# Patient Record
Sex: Female | Born: 1935 | Race: Black or African American | Hispanic: No | Smoking: Never smoker
Health system: Southern US, Community
[De-identification: ages and names within clinical notes are randomized; demographics above are authoritative.]

## PROBLEM LIST (undated history)

## (undated) DIAGNOSIS — E119 Type 2 diabetes mellitus without complications: Secondary | ICD-10-CM

## (undated) DIAGNOSIS — I10 Essential (primary) hypertension: Secondary | ICD-10-CM

## (undated) DIAGNOSIS — N289 Disorder of kidney and ureter, unspecified: Secondary | ICD-10-CM

## (undated) DIAGNOSIS — I2510 Atherosclerotic heart disease of native coronary artery without angina pectoris: Secondary | ICD-10-CM

## (undated) DIAGNOSIS — C801 Malignant (primary) neoplasm, unspecified: Secondary | ICD-10-CM

## (undated) DIAGNOSIS — C9110 Chronic lymphocytic leukemia of B-cell type not having achieved remission: Secondary | ICD-10-CM

## (undated) DIAGNOSIS — H269 Unspecified cataract: Secondary | ICD-10-CM

## (undated) DIAGNOSIS — N390 Urinary tract infection, site not specified: Secondary | ICD-10-CM

## (undated) DIAGNOSIS — T148XXA Other injury of unspecified body region, initial encounter: Secondary | ICD-10-CM

## (undated) HISTORY — PX: TOTAL HIP ARTHROPLASTY: SHX124

## (undated) HISTORY — PX: CHOLECYSTECTOMY: SHX55

## (undated) HISTORY — PX: APPENDECTOMY: SHX54

---

## 2007-06-17 ENCOUNTER — Other Ambulatory Visit: Payer: Self-pay

## 2007-06-17 ENCOUNTER — Inpatient Hospital Stay: Payer: Self-pay | Admitting: Orthopaedic Surgery

## 2007-06-22 ENCOUNTER — Encounter: Payer: Self-pay | Admitting: Internal Medicine

## 2007-07-12 ENCOUNTER — Encounter: Payer: Self-pay | Admitting: Internal Medicine

## 2008-09-06 ENCOUNTER — Observation Stay: Payer: Self-pay | Admitting: Internal Medicine

## 2008-09-15 ENCOUNTER — Ambulatory Visit: Payer: Self-pay | Admitting: Unknown Physician Specialty

## 2010-06-19 ENCOUNTER — Observation Stay: Payer: Self-pay | Admitting: Internal Medicine

## 2011-12-30 ENCOUNTER — Emergency Department: Payer: Self-pay | Admitting: *Deleted

## 2011-12-30 LAB — CBC WITH DIFFERENTIAL/PLATELET
Basophil #: 0 10*3/uL (ref 0.0–0.1)
Basophil %: 0.5 %
Eosinophil %: 1 %
HCT: 41 % (ref 35.0–47.0)
HGB: 13.4 g/dL (ref 12.0–16.0)
Lymphocyte %: 39.6 %
MCHC: 32.7 g/dL (ref 32.0–36.0)
Monocyte %: 4.8 %
Neutrophil #: 4.5 10*3/uL (ref 1.4–6.5)
Platelet: 150 10*3/uL (ref 150–440)
RBC: 4.08 10*6/uL (ref 3.80–5.20)
WBC: 8.3 10*3/uL (ref 3.6–11.0)

## 2011-12-30 LAB — COMPREHENSIVE METABOLIC PANEL
Alkaline Phosphatase: 48 U/L — ABNORMAL LOW (ref 50–136)
BUN: 20 mg/dL — ABNORMAL HIGH (ref 7–18)
Bilirubin,Total: 0.5 mg/dL (ref 0.2–1.0)
Chloride: 104 mmol/L (ref 98–107)
Co2: 31 mmol/L (ref 21–32)
Creatinine: 1.21 mg/dL (ref 0.60–1.30)
EGFR (Non-African Amer.): 38 — ABNORMAL LOW
Osmolality: 289 (ref 275–301)
SGPT (ALT): 17 U/L
Total Protein: 6.9 g/dL (ref 6.4–8.2)

## 2011-12-30 LAB — TROPONIN I: Troponin-I: 0.03 ng/mL

## 2011-12-30 LAB — CK TOTAL AND CKMB (NOT AT ARMC)
CK, Total: 35 U/L (ref 21–215)
CK-MB: <0.5 ng/mL — ABNORMAL LOW (ref 0.5–3.6)

## 2013-08-13 ENCOUNTER — Ambulatory Visit: Payer: Self-pay | Admitting: Internal Medicine

## 2013-09-08 ENCOUNTER — Ambulatory Visit: Payer: Self-pay | Admitting: Internal Medicine

## 2016-04-16 ENCOUNTER — Emergency Department (HOSPITAL_COMMUNITY): Payer: Medicare (Managed Care)

## 2016-04-16 ENCOUNTER — Encounter (HOSPITAL_COMMUNITY): Payer: Self-pay | Admitting: *Deleted

## 2016-04-16 ENCOUNTER — Inpatient Hospital Stay (HOSPITAL_COMMUNITY)
Admission: EM | Admit: 2016-04-16 | Discharge: 2016-04-18 | DRG: 292 | Disposition: A | Discharge status: Home Health | Payer: Medicare (Managed Care) | Attending: Family Medicine | Admitting: Family Medicine

## 2016-04-16 DIAGNOSIS — I110 Hypertensive heart disease with heart failure: Secondary | ICD-10-CM | POA: Diagnosis present

## 2016-04-16 DIAGNOSIS — D72829 Elevated white blood cell count, unspecified: Secondary | ICD-10-CM | POA: Diagnosis not present

## 2016-04-16 DIAGNOSIS — E876 Hypokalemia: Secondary | ICD-10-CM | POA: Diagnosis not present

## 2016-04-16 DIAGNOSIS — W19XXXA Unspecified fall, initial encounter: Secondary | ICD-10-CM | POA: Diagnosis present

## 2016-04-16 DIAGNOSIS — E119 Type 2 diabetes mellitus without complications: Secondary | ICD-10-CM

## 2016-04-16 DIAGNOSIS — I2510 Atherosclerotic heart disease of native coronary artery without angina pectoris: Secondary | ICD-10-CM | POA: Diagnosis present

## 2016-04-16 DIAGNOSIS — Z7982 Long term (current) use of aspirin: Secondary | ICD-10-CM | POA: Diagnosis not present

## 2016-04-16 DIAGNOSIS — I509 Heart failure, unspecified: Secondary | ICD-10-CM

## 2016-04-16 DIAGNOSIS — W010XXA Fall on same level from slipping, tripping and stumbling without subsequent striking against object, initial encounter: Secondary | ICD-10-CM | POA: Diagnosis present

## 2016-04-16 DIAGNOSIS — Z66 Do not resuscitate: Secondary | ICD-10-CM | POA: Diagnosis present

## 2016-04-16 DIAGNOSIS — F0280 Dementia in other diseases classified elsewhere without behavioral disturbance: Secondary | ICD-10-CM | POA: Diagnosis present

## 2016-04-16 DIAGNOSIS — R0902 Hypoxemia: Secondary | ICD-10-CM | POA: Diagnosis present

## 2016-04-16 DIAGNOSIS — I5031 Acute diastolic (congestive) heart failure: Secondary | ICD-10-CM | POA: Diagnosis present

## 2016-04-16 DIAGNOSIS — Z96642 Presence of left artificial hip joint: Secondary | ICD-10-CM | POA: Diagnosis present

## 2016-04-16 DIAGNOSIS — M25552 Pain in left hip: Secondary | ICD-10-CM | POA: Diagnosis present

## 2016-04-16 DIAGNOSIS — R748 Abnormal levels of other serum enzymes: Secondary | ICD-10-CM

## 2016-04-16 DIAGNOSIS — C9110 Chronic lymphocytic leukemia of B-cell type not having achieved remission: Secondary | ICD-10-CM | POA: Diagnosis present

## 2016-04-16 DIAGNOSIS — G309 Alzheimer's disease, unspecified: Secondary | ICD-10-CM | POA: Diagnosis present

## 2016-04-16 DIAGNOSIS — I10 Essential (primary) hypertension: Secondary | ICD-10-CM | POA: Diagnosis present

## 2016-04-16 DIAGNOSIS — Z7984 Long term (current) use of oral hypoglycemic drugs: Secondary | ICD-10-CM | POA: Diagnosis not present

## 2016-04-16 DIAGNOSIS — D181 Lymphangioma, any site: Secondary | ICD-10-CM | POA: Diagnosis present

## 2016-04-16 HISTORY — DX: Disorder of kidney and ureter, unspecified: N28.9

## 2016-04-16 HISTORY — DX: Type 2 diabetes mellitus without complications: E11.9

## 2016-04-16 HISTORY — DX: Malignant (primary) neoplasm, unspecified: C80.1

## 2016-04-16 HISTORY — DX: Urinary tract infection, site not specified: N39.0

## 2016-04-16 HISTORY — DX: Unspecified cataract: H26.9

## 2016-04-16 HISTORY — DX: Other injury of unspecified body region, initial encounter: T14.8XXA

## 2016-04-16 HISTORY — DX: Chronic lymphocytic leukemia of B-cell type not having achieved remission: C91.10

## 2016-04-16 HISTORY — DX: Atherosclerotic heart disease of native coronary artery without angina pectoris: I25.10

## 2016-04-16 HISTORY — DX: Essential (primary) hypertension: I10

## 2016-04-16 LAB — CBC WITH DIFFERENTIAL/PLATELET
BASOS ABS: 0 10*3/uL (ref 0.0–0.1)
BASOS PCT: 0 %
EOS ABS: 0.2 10*3/uL (ref 0.0–0.7)
Eosinophils Relative: 1 %
HEMATOCRIT: 39.6 % (ref 36.0–46.0)
HEMOGLOBIN: 13.7 g/dL (ref 12.0–15.0)
LYMPHS PCT: 52 %
Lymphs Abs: 8 10*3/uL — ABNORMAL HIGH (ref 0.7–4.0)
MCH: 32.3 pg (ref 26.0–34.0)
MCHC: 34.6 g/dL (ref 30.0–36.0)
MCV: 93.4 fL (ref 78.0–100.0)
MONOS PCT: 5 %
Monocytes Absolute: 0.8 10*3/uL (ref 0.1–1.0)
NEUTROS ABS: 6.6 10*3/uL (ref 1.7–7.7)
NEUTROS PCT: 42 %
Platelets: 163 10*3/uL (ref 150–400)
RBC: 4.24 MIL/uL (ref 3.87–5.11)
RDW: 13.3 % (ref 11.5–15.5)
WBC: 15.6 10*3/uL — ABNORMAL HIGH (ref 4.0–10.5)

## 2016-04-16 LAB — URINALYSIS, ROUTINE W REFLEX MICROSCOPIC
Bilirubin Urine: NEGATIVE
Glucose, UA: NEGATIVE mg/dL
Hgb urine dipstick: NEGATIVE
Ketones, ur: 15 mg/dL — AB
NITRITE: NEGATIVE
PH: 7.5 (ref 5.0–8.0)
Protein, ur: NEGATIVE mg/dL
SPECIFIC GRAVITY, URINE: 1.013 (ref 1.005–1.030)

## 2016-04-16 LAB — BRAIN NATRIURETIC PEPTIDE: B Natriuretic Peptide: 179.2 pg/mL — ABNORMAL HIGH (ref 0.0–100.0)

## 2016-04-16 LAB — BASIC METABOLIC PANEL
Anion gap: 7 (ref 5–15)
BUN: 15 mg/dL (ref 6–20)
CHLORIDE: 104 mmol/L (ref 101–111)
CO2: 29 mmol/L (ref 22–32)
Calcium: 10.2 mg/dL (ref 8.9–10.3)
Creatinine, Ser: 0.9 mg/dL (ref 0.44–1.00)
GFR calc non Af Amer: 52 mL/min — ABNORMAL LOW (ref >60)
GFR, EST AFRICAN AMERICAN: 60 mL/min — AB (ref >60)
Glucose, Bld: 126 mg/dL — ABNORMAL HIGH (ref 65–99)
POTASSIUM: 3.6 mmol/L (ref 3.5–5.1)
SODIUM: 140 mmol/L (ref 135–145)

## 2016-04-16 LAB — HEPATIC FUNCTION PANEL
ALBUMIN: 3.9 g/dL (ref 3.5–5.0)
ALT: 11 U/L — AB (ref 14–54)
AST: 18 U/L (ref 15–41)
Alkaline Phosphatase: 44 U/L (ref 38–126)
Bilirubin, Direct: 0.1 mg/dL (ref 0.1–0.5)
Indirect Bilirubin: 0.7 mg/dL (ref 0.3–0.9)
TOTAL PROTEIN: 6 g/dL — AB (ref 6.5–8.1)
Total Bilirubin: 0.8 mg/dL (ref 0.3–1.2)

## 2016-04-16 LAB — URINE MICROSCOPIC-ADD ON: RBC / HPF: NONE SEEN RBC/hpf (ref 0–5)

## 2016-04-16 LAB — TROPONIN I: TROPONIN I: 0.1 ng/mL — AB (ref <0.03)

## 2016-04-16 LAB — GLUCOSE, CAPILLARY: Glucose-Capillary: 248 mg/dL — ABNORMAL HIGH (ref 65–99)

## 2016-04-16 LAB — PATHOLOGIST SMEAR REVIEW

## 2016-04-16 MED ORDER — SODIUM CHLORIDE 0.9% FLUSH: 3.0000 mL | INTRAVENOUS | Status: DC | PRN

## 2016-04-16 MED ORDER — FENTANYL CITRATE (PF) 100 MCG/2ML IJ SOLN
25.0000 ug | INTRAMUSCULAR | Status: DC | PRN
  Administered 2016-04-16: 25 ug via INTRAVENOUS
  Filled 2016-04-16: qty 2

## 2016-04-16 MED ORDER — POLYETHYLENE GLYCOL 3350 17 G PO PACK: 17.0000 g | PACK | Freq: Every day | ORAL | Status: DC | PRN

## 2016-04-16 MED ORDER — FUROSEMIDE 10 MG/ML IJ SOLN
20.0000 mg | Freq: Every day | INTRAMUSCULAR | Status: DC
  Administered 2016-04-17 – 2016-04-18 (×2): 20 mg via INTRAVENOUS
  Filled 2016-04-16 (×2): qty 2

## 2016-04-16 MED ORDER — SODIUM CHLORIDE 0.9 % IV SOLN: 250.0000 mL | INTRAVENOUS | Status: DC | PRN

## 2016-04-16 MED ORDER — ASPIRIN 81 MG PO CHEW
324.0000 mg | CHEWABLE_TABLET | Freq: Once | ORAL | Status: AC
  Administered 2016-04-16: 324 mg via ORAL
  Filled 2016-04-16: qty 4

## 2016-04-16 MED ORDER — ASPIRIN EC 81 MG PO TBEC
81.0000 mg | DELAYED_RELEASE_TABLET | Freq: Every day | ORAL | Status: DC
  Administered 2016-04-17 – 2016-04-18 (×2): 81 mg via ORAL
  Filled 2016-04-16 (×2): qty 1

## 2016-04-16 MED ORDER — METFORMIN HCL 500 MG PO TABS
1000.0000 mg | ORAL_TABLET | Freq: Every day | ORAL | Status: DC
  Administered 2016-04-17 – 2016-04-18 (×2): 1000 mg via ORAL
  Filled 2016-04-16 (×2): qty 2

## 2016-04-16 MED ORDER — FUROSEMIDE 10 MG/ML IJ SOLN
20.0000 mg | Freq: Once | INTRAMUSCULAR | Status: AC
  Administered 2016-04-16: 20 mg via INTRAVENOUS
  Filled 2016-04-16: qty 4

## 2016-04-16 MED ORDER — SENNOSIDES-DOCUSATE SODIUM 8.6-50 MG PO TABS
1.0000 | ORAL_TABLET | Freq: Two times a day (BID) | ORAL | Status: DC
  Administered 2016-04-16 – 2016-04-18 (×4): 1 via ORAL
  Filled 2016-04-16 (×4): qty 1

## 2016-04-16 MED ORDER — DONEPEZIL HCL 10 MG PO TABS
10.0000 mg | ORAL_TABLET | Freq: Every day | ORAL | Status: DC
  Administered 2016-04-16 – 2016-04-17 (×2): 10 mg via ORAL
  Filled 2016-04-16 (×2): qty 1

## 2016-04-16 MED ORDER — LEVOTHYROXINE SODIUM 50 MCG PO TABS
50.0000 ug | ORAL_TABLET | Freq: Every day | ORAL | Status: DC
  Administered 2016-04-17 – 2016-04-18 (×2): 50 ug via ORAL
  Filled 2016-04-16 (×2): qty 1

## 2016-04-16 MED ORDER — SODIUM CHLORIDE 0.9% FLUSH
3.0000 mL | Freq: Two times a day (BID) | INTRAVENOUS | Status: DC
  Administered 2016-04-16 – 2016-04-18 (×4): 3 mL via INTRAVENOUS

## 2016-04-16 MED ORDER — MAGNESIUM HYDROXIDE 400 MG/5ML PO SUSP: 30.0000 mL | Freq: Every day | ORAL | Status: DC | PRN

## 2016-04-16 MED ORDER — ONDANSETRON HCL 4 MG/2ML IJ SOLN
4.0000 mg | Freq: Four times a day (QID) | INTRAMUSCULAR | Status: DC | PRN
  Administered 2016-04-16: 4 mg via INTRAVENOUS
  Filled 2016-04-16: qty 2

## 2016-04-16 MED ORDER — ENOXAPARIN SODIUM 40 MG/0.4ML ~~LOC~~ SOLN
40.0000 mg | SUBCUTANEOUS | Status: DC
  Administered 2016-04-16 – 2016-04-17 (×2): 40 mg via SUBCUTANEOUS
  Filled 2016-04-16 (×2): qty 0.4

## 2016-04-16 MED ORDER — AMLODIPINE BESYLATE 5 MG PO TABS
5.0000 mg | ORAL_TABLET | Freq: Every day | ORAL | Status: DC
  Administered 2016-04-17 – 2016-04-18 (×2): 5 mg via ORAL
  Filled 2016-04-16 (×2): qty 1

## 2016-04-16 MED ORDER — BENAZEPRIL HCL 10 MG PO TABS
20.0000 mg | ORAL_TABLET | Freq: Every day | ORAL | Status: DC
  Administered 2016-04-17 – 2016-04-18 (×2): 20 mg via ORAL
  Filled 2016-04-16 (×3): qty 2

## 2016-04-16 MED ORDER — ACETAMINOPHEN 500 MG PO TABS: 500.0000 mg | ORAL_TABLET | Freq: Four times a day (QID) | ORAL | Status: DC | PRN

## 2016-04-16 NOTE — ED Notes (Signed)
Patient transported to X-ray 

## 2016-04-16 NOTE — ED Notes (Signed)
Applied 2L oxygen after administration of Fentanyl 25 mcg due to hypoxia. Pt mentation is at baseline.

## 2016-04-16 NOTE — Progress Notes (Addendum)
Pt's daughter and pt confirms pt has pcp at [place] pt with DME at home walker, cane and bedside commode

## 2016-04-16 NOTE — ED Triage Notes (Signed)
Pt BIB EMS from home after fall. She was found on her left side were she has PMH of  lf. Hip replacement. Per EMS, there was no tenderness assessed on palpation. Member in family states she has syncope episode when heart rate drops.  She had PAC's on the monitor according to EKG performed in route.

## 2016-04-16 NOTE — H&P (Signed)
History and Physical  Jessica Daniel [id number] DOB: 1915/12/09 DOA: 04/16/2016  Referring physician:  Duffy Bruce, ER physician PCP: Sherian Maroon, MD as part of pace program  Patient coming from: Home & is able to ambulate using a walker  Chief Complaint: Fall   HPI: Jessica Daniel is a 82 y.o. female with medical history significant of dementia, diabetes mellitus and hypertension who lives at home with her granddaughter and often times will spend a day or 2 in bed and then get up and ambulate. Today she lost her balance and fell landing on her left side. She had trouble getting up so paramedics were called. Patient was brought into the emergency room further evaluation  ED Course: In the emergency room, x-rays showed no evidence of fracture. Patient did look to have more labored breathing. A chest x-ray noted some mild bilateral effusions consistent with mild CHF. Patient with no previous diagnosis. Rest of lab work unremarkable other than white count of 15.6, troponin of 0.1 and a BNP of 180. Hospitalists call for further evaluation.  Review of Systems: Patient seen in the emergency room . With her chronic dementia, she is not a good historian. She denies any complaints. Says that she is okay. Denies any shortness of breath or chest pain. Denies any hip pain    Review of systems are otherwise negative  Past Medical History:  Diagnosis Date  . Cancer ([place])   . Cataract   . CLL (chronic lymphocytic leukemia) ([place])   . Coronary artery disease   . Diabetes mellitus without complication ([place])   . Fracture    lf. wrist, hip, ankle  . Hypertension   . Renal disorder   . UTI (urinary tract infection)    Past Surgical History:  Procedure Laterality Date  . APPENDECTOMY    . CHOLECYSTECTOMY    . TOTAL HIP ARTHROPLASTY Left     Social History:  reports that she has never smoked. She has never used smokeless tobacco. She reports that she does not drink alcohol or use  drugs. She lives at home with her granddaughter   No Known Allergies  Family history: High blood pressure  Prior to Admission medications   Medication Sig Start Date End Date Taking? Authorizing Provider  acetaminophen (TYLENOL) 500 MG tablet Take 500-1,000 mg by mouth every 6 (six) hours as needed for moderate pain.   Yes Historical Provider, MD  amLODipine (NORVASC) 5 MG tablet Take 5 mg by mouth daily.   Yes Historical Provider, MD  aspirin EC 81 MG tablet Take 81 mg by mouth daily.   Yes Historical Provider, MD  benazepril (LOTENSIN) 20 MG tablet Take 20 mg by mouth daily.   Yes Historical Provider, MD  donepezil (ARICEPT) 10 MG tablet Take 10 mg by mouth at bedtime.   Yes Historical Provider, MD  levothyroxine (SYNTHROID, LEVOTHROID) 50 MCG tablet Take 50 mcg by mouth daily before breakfast.   Yes Historical Provider, MD  magnesium hydroxide (MILK OF MAGNESIA) 400 MG/5ML suspension Take 30 mLs by mouth daily as needed for mild constipation or moderate constipation.   Yes Historical Provider, MD  metFORMIN (GLUCOPHAGE) 1000 MG tablet Take 1,000 mg by mouth daily with breakfast.   Yes Historical Provider, MD  Multiple Minerals-Vitamins (CALCIUM & VIT D3 BONE HEALTH PO) Take 1 tablet by mouth every evening.   Yes Historical Provider, MD  Multiple Vitamins-Minerals (MULTIVITAMIN WITH MINERALS) tablet Take 1 tablet by mouth daily.   Yes Historical Provider, MD  polyethylene glycol (MIRALAX / GLYCOLAX) packet Take 17 g by mouth daily as needed for moderate constipation.   Yes Historical Provider, MD  sennosides-docusate sodium (SENOKOT-S) 8.6-50 MG tablet Take 1 tablet by mouth 2 (two) times daily.   Yes Historical Provider, MD    Physical Exam: BP 145/74   Pulse 63   Temp 97.8 F (36.6 C) (Oral)   Resp 19   Ht 5\' 7"  (1.702 m)   Wt 74.4 kg (164 lb 0.4 oz)   SpO2 100%   BMI 25.69 kg/m   General:  Alert and oriented 2, no acute distress  Eyes: Sclera nonicteric, extraocular  movements are intact  ENT: Normocephalic major rheumatic, mucous membranes are slightly dry  Neck: Supple, no JVD  Cardiovascular: Soft, regular rate and rhythm, [id number], 2/6 systolic ejection murmur  Respiratory: Clear auscultation bilaterally, decreased breath sounds bibasilar Abdomen: Soft, obese, nontender, positive bowel sounds  Skin: No skin breaks, tears or lesions  Musculoskeletal: No clubbing or cyanosis, trace pitting edema from the knees down bilaterally  Psychiatric: Patient is appropriate, no evidence of psychoses, chronic dementia  Neurologic: No focal deficits           Labs on Admission:  Basic Metabolic Panel:  Recent Labs Lab 04/16/16 1004  NA 140  K 3.6  CL 104  CO2 29  GLUCOSE 126*  BUN 15  CREATININE 0.90  CALCIUM 10.2   Liver Function Tests:  Recent Labs Lab 04/16/16 1004  AST 18  ALT 11*  ALKPHOS 44  BILITOT 0.8  PROT 6.0*  ALBUMIN 3.9   No results for input(s): LIPASE, AMYLASE in the last 168 hours. No results for input(s): AMMONIA in the last 168 hours. CBC:  Recent Labs Lab 04/16/16 1004  WBC 15.6*  NEUTROABS 6.6  HGB 13.7  HCT 39.6  MCV 93.4  PLT 163   Cardiac Enzymes:  Recent Labs Lab 04/16/16 1004  TROPONINI 0.10*    BNP (last 3 results)  Recent Labs  04/16/16 1004  BNP 179.2*    ProBNP (last 3 results) No results for input(s): PROBNP in the last 8760 hours.  CBG: No results for input(s): GLUCAP in the last 168 hours.  Radiological Exams on Admission: Dg Chest 2 View  Result Date: 04/16/2016 CLINICAL DATA:  Unwitnessed fall today with patient found on the floor. No cardiopulmonary complaints. History of diabetes, hypertension, atrial fibrillation. EXAM: CHEST  2 VIEW COMPARISON:  None in PACs FINDINGS: The lungs are mildly hypoinflated. The interstitial markings are coarse in the left lower lobe. The cardiac silhouette is enlarged. The central pulmonary vascularity is engorged. There is calcification in the wall  of the aortic arch. There is no pleural effusion. The bony thorax exhibits no acute abnormality. IMPRESSION: Cardiomegaly with pulmonary vascular congestion suggests low-grade compensated CHF. Left lower lobe subsegmental atelectasis is suspected. Aortic atherosclerosis. Electronically Signed   By: David  Martinique M.D.   On: 04/16/2016 10:07   Ct Head Wo Contrast  Result Date: 04/16/2016 CLINICAL DATA:  Fall EXAM: CT HEAD WITHOUT CONTRAST CT CERVICAL SPINE WITHOUT CONTRAST TECHNIQUE: Multidetector CT imaging of the head and cervical spine was performed following the standard protocol without intravenous contrast. Multiplanar CT image reconstructions of the cervical spine were also generated. COMPARISON:  None. FINDINGS: CT HEAD FINDINGS Brain: Bifrontal extra-axial CSF fluid collections measuring 12 mm in thickness bilaterally. Prominent CSF also noted around the cerebellum bilaterally. No evidence of acute high-density hemorrhage. Ventricle size normal. Mild shift of the midline structures to  the left approximately 2 mm. Mild chronic microvascular ischemic change in the white matter. No acute infarct or mass. Vascular: No hyperdense vessel or unexpected calcification. Skull: Fracture of the right orbital floor of indeterminate age. No fluid in the sinuses. CT face recommended if there is acute facial trauma. Otherwise no skull fracture. Sinuses/Orbits: Mastoid sinus effusion on the left with air-fluid level. Middle ear effusion on the left. Right mastoid sinus clear. Mucosal thickening and chronic bony thickening of the sphenoid sinus. Remaining paranasal sinuses clear. Other: None CT CERVICAL SPINE FINDINGS Alignment: Slight anterior slip C7-T1. Straightening of the cervical lordosis. Skull base and vertebrae: Negative for cervical spine fracture. Soft tissues and spinal canal: Negative for mass or adenopathy. Mild carotid artery calcification. Disc levels:  C2-3:  Negative C3-4: Disc degeneration and  spondylosis. Bilateral facet degeneration. Mild spinal stenosis and moderate right foraminal encroachment. C4-5: Disc degeneration with uncinate spurring. Facet degeneration the left. No significant stenosis C5-6: Disc degeneration with diffuse uncinate spurring. Mild facet degeneration. Mild canal stenosis and mild foraminal stenosis bilaterally. C6-7: Disc degeneration with diffuse uncinate spurring. Mild spinal stenosis and mild foraminal narrowing bilaterally C7-T1: Mild anterior slip due to bilateral facet degeneration. No significant stenosis. Upper chest: Lung apices clear. Other: None IMPRESSION: 12 mm subdural hygroma bilaterally. This could be due to acute injury or could be chronic. Prominent CSF around the cerebellum bilaterally. No prior study. No evidence of acute intracranial hemorrhage. Mild chronic microvascular ischemic change in the white matter. No acute infarct. Right orbital floor fracture of indeterminate age. There is no fluid in the sinuses. If the patient has acute facial trauma, CT face recommended for further evaluation Cervical spondylosis.  Negative for fracture of the cervical spine. Electronically Signed   By: Franchot Gallo M.D.   On: 04/16/2016 09:57   Ct Cervical Spine Wo Contrast  Result Date: 04/16/2016 CLINICAL DATA:  Fall EXAM: CT HEAD WITHOUT CONTRAST CT CERVICAL SPINE WITHOUT CONTRAST TECHNIQUE: Multidetector CT imaging of the head and cervical spine was performed following the standard protocol without intravenous contrast. Multiplanar CT image reconstructions of the cervical spine were also generated. COMPARISON:  None. FINDINGS: CT HEAD FINDINGS Brain: Bifrontal extra-axial CSF fluid collections measuring 12 mm in thickness bilaterally. Prominent CSF also noted around the cerebellum bilaterally. No evidence of acute high-density hemorrhage. Ventricle size normal. Mild shift of the midline structures to the left approximately 2 mm. Mild chronic microvascular ischemic  change in the white matter. No acute infarct or mass. Vascular: No hyperdense vessel or unexpected calcification. Skull: Fracture of the right orbital floor of indeterminate age. No fluid in the sinuses. CT face recommended if there is acute facial trauma. Otherwise no skull fracture. Sinuses/Orbits: Mastoid sinus effusion on the left with air-fluid level. Middle ear effusion on the left. Right mastoid sinus clear. Mucosal thickening and chronic bony thickening of the sphenoid sinus. Remaining paranasal sinuses clear. Other: None CT CERVICAL SPINE FINDINGS Alignment: Slight anterior slip C7-T1. Straightening of the cervical lordosis. Skull base and vertebrae: Negative for cervical spine fracture. Soft tissues and spinal canal: Negative for mass or adenopathy. Mild carotid artery calcification. Disc levels:  C2-3:  Negative C3-4: Disc degeneration and spondylosis. Bilateral facet degeneration. Mild spinal stenosis and moderate right foraminal encroachment. C4-5: Disc degeneration with uncinate spurring. Facet degeneration the left. No significant stenosis C5-6: Disc degeneration with diffuse uncinate spurring. Mild facet degeneration. Mild canal stenosis and mild foraminal stenosis bilaterally. C6-7: Disc degeneration with diffuse uncinate spurring. Mild spinal stenosis and  mild foraminal narrowing bilaterally C7-T1: Mild anterior slip due to bilateral facet degeneration. No significant stenosis. Upper chest: Lung apices clear. Other: None IMPRESSION: 12 mm subdural hygroma bilaterally. This could be due to acute injury or could be chronic. Prominent CSF around the cerebellum bilaterally. No prior study. No evidence of acute intracranial hemorrhage. Mild chronic microvascular ischemic change in the white matter. No acute infarct. Right orbital floor fracture of indeterminate age. There is no fluid in the sinuses. If the patient has acute facial trauma, CT face recommended for further evaluation Cervical spondylosis.   Negative for fracture of the cervical spine. Electronically Signed   By: Franchot Gallo M.D.   On: 04/16/2016 09:57   Dg Hip Unilat W Or Wo Pelvis 2-3 Views Left  Result Date: 04/16/2016 CLINICAL DATA:  Unwitnessed fall today. The patient reports left hip pain. History of previous ORIF for left hip fracture. EXAM: DG HIP (WITH OR WITHOUT PELVIS) 2-3V LEFT COMPARISON:  None in PACs FINDINGS: A telescoping screw with sideplate are present for fixation of a previous femoral neck fracture. No acute fracture is observed. The interface of the cortical screws with the native bone appears normal. There is mild narrowing of both hip joint spaces. There is diffuse osteopenia of the bony structures. The observed portions of the bony pelvis are normal. IMPRESSION: No acute left hip fracture is observed. Previous ORIF with sideplate and telescoping screw appears intact. The observed portions of the bony pelvis exhibit no acute fractures. Electronically Signed   By: David  Martinique M.D.   On: 04/16/2016 10:09    EKG: Independently reviewed. Normal sinus rhythm with prolonged QT of 487, mild LVH   Assessment/Plan Present on Admission: . Acute diastolic heart failure ([place]): Will try gentle diuresis. No echo as per granddaughter's agreement for nothing aggressive or invasive . Hypertension: Continue home medications . CLL (chronic lymphocytic leukemia) ([place]) . Fall: We'll have physical therapy assess . 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 Continue to cycle enzymes. If she does start having signs of an acute non-ST elevated MI, would medically manage, nothing invasive  Leukocytosis: May be stress margination or related to her CLL. No signs of infection. Chest x-ray and urine unrevealing. Repeat labs in the morning. No fever. previous labs we have are from 4 years ago   DVT prophylaxis: Lovenox   Code  Status: DO NOT RESUSCITATE as confirmed by granddaughter   Family Communication: Granddaughter at the bedside   Disposition Plan: Anticipate discharge home tomorrow given plans for non-aggressive intervention, no echo and only mild diuresis needed  Consults called: None, PT will see patient in morning   Admission status:  Given potential discharge tomorrow, place or observation   Annita Brod MD [facility] Pager [phone]  If 7PM-7AM, please contact night-coverage [email or web address] Password [facility]  04/16/2016, 6:13 PM

## 2016-04-16 NOTE — ED Notes (Signed)
Family at bedside.granddaughter

## 2016-04-16 NOTE — ED Provider Notes (Signed)
[place] DEPT Provider Note   CSN: [id number] Arrival date & time: 04/16/16  0857     History   Chief Complaint Chief Complaint  Patient presents with  . Fall  . Hip Pain    HPI Jessica Daniel is a 82 y.o. female.  HPI 82 year old female who presents with left hip pain status post fall. Patient is mildly confused, limiting history. According to her report, she syncopized at church on Sunday. Denies any preceding or subsequent chest pain, shortness of breath, and states she just "passed out." Since then, she has been at home. According to EMS, patient has had increasing episodes of falls according to family, with reported "drops" in her heart rate. Currently, patient endorses 210 left hip pain that is aching, throbbing, and worse only with movement, palpation, or weightbearing. Denies any blood thinner use. Denies any direct head injury but states she "may have hit it." Denies any history of previous injuries to her hip. No numbness or weakness  Past Medical History:  Diagnosis Date  . Cancer ([place])   . Cataract   . CLL (chronic lymphocytic leukemia) ([place])   . Coronary artery disease   . Diabetes mellitus without complication ([place])   . Fracture    lf. wrist, hip, ankle  . Hypertension   . Renal disorder   . UTI (urinary tract infection)     Patient Active Problem List   Diagnosis Date Noted  . Acute diastolic heart failure ([place]) 04/16/2016  . Diabetes mellitus without complication ([place]) [id number]  . Hypertension 04/16/2016  . CLL (chronic lymphocytic leukemia) ([place]) 04/16/2016  . Fall 04/16/2016  . Dementia of the Alzheimer's type without behavioral disturbance 04/16/2016    Past Surgical History:  Procedure Laterality Date  . APPENDECTOMY    . CHOLECYSTECTOMY    . TOTAL HIP ARTHROPLASTY Left     OB History    No data available       Home Medications    Prior to Admission medications   Medication Sig Start Date End Date Taking? Authorizing Provider    acetaminophen (TYLENOL) 500 MG tablet Take 500-1,000 mg by mouth every 6 (six) hours as needed for moderate pain.   Yes Historical Provider, MD  amLODipine (NORVASC) 5 MG tablet Take 5 mg by mouth daily.   Yes Historical Provider, MD  aspirin EC 81 MG tablet Take 81 mg by mouth daily.   Yes Historical Provider, MD  benazepril (LOTENSIN) 20 MG tablet Take 20 mg by mouth daily.   Yes Historical Provider, MD  donepezil (ARICEPT) 10 MG tablet Take 10 mg by mouth at bedtime.   Yes Historical Provider, MD  levothyroxine (SYNTHROID, LEVOTHROID) 50 MCG tablet Take 50 mcg by mouth daily before breakfast.   Yes Historical Provider, MD  magnesium hydroxide (MILK OF MAGNESIA) 400 MG/5ML suspension Take 30 mLs by mouth daily as needed for mild constipation or moderate constipation.   Yes Historical Provider, MD  metFORMIN (GLUCOPHAGE) 1000 MG tablet Take 1,000 mg by mouth daily with breakfast.   Yes Historical Provider, MD  Multiple Minerals-Vitamins (CALCIUM & VIT D3 BONE HEALTH PO) Take 1 tablet by mouth every evening.   Yes Historical Provider, MD  Multiple Vitamins-Minerals (MULTIVITAMIN WITH MINERALS) tablet Take 1 tablet by mouth daily.   Yes Historical Provider, MD  polyethylene glycol (MIRALAX / GLYCOLAX) packet Take 17 g by mouth daily as needed for moderate constipation.   Yes Historical Provider, MD  sennosides-docusate sodium (SENOKOT-S) 8.6-50 MG tablet Take 1 tablet  by mouth 2 (two) times daily.   Yes Historical Provider, MD    Family History History reviewed. No pertinent family history.  Social History Social History  Substance Use Topics  . Smoking status: Never Smoker  . Smokeless tobacco: Never Used  . Alcohol use No     Allergies   Patient has no known allergies.   Review of Systems Review of Systems  Constitutional: Positive for fatigue. Negative for chills and fever.  HENT: Negative for congestion, rhinorrhea and sore throat.   Eyes: Negative for visual disturbance.   Respiratory: Negative for cough, shortness of breath and wheezing.   Cardiovascular: Negative for chest pain and leg swelling.  Gastrointestinal: Negative for abdominal pain, diarrhea, nausea and vomiting.  Genitourinary: Negative for dysuria, flank pain, vaginal bleeding and vaginal discharge.  Musculoskeletal: Positive for arthralgias and gait problem. Negative for neck pain.  Skin: Negative for rash.  Allergic/Immunologic: Negative for immunocompromised state.  Neurological: Positive for syncope and weakness. Negative for headaches.  Hematological: Does not bruise/bleed easily.  All other systems reviewed and are negative.    Physical Exam Updated Vital Signs BP (!) 147/62 (BP Location: Right Arm)   Pulse 65   Temp 97.8 F (36.6 C) (Oral)   Resp 18   Ht 5\' 7"  (1.702 m)   Wt 164 lb 0.4 oz (74.4 kg)   SpO2 94%   BMI 25.69 kg/m   Physical Exam  Constitutional: She is oriented to person, place, and time. She appears well-developed and well-nourished. No distress.  Elderly, frail-appearing  HENT:  Head: Normocephalic and atraumatic.  Eyes: Conjunctivae are normal.  Neck: Neck supple.  Cardiovascular: Normal rate, regular rhythm and normal heart sounds.  Exam reveals no friction rub.   No murmur heard. Pulmonary/Chest: Effort normal and breath sounds normal. No respiratory distress. She has no wheezes. She has no rales.  Abdominal: She exhibits no distension.  Musculoskeletal: She exhibits no edema.  Neurological: She is alert and oriented to person, place, and time. She exhibits normal muscle tone.  Skin: Skin is warm. Capillary refill takes less than 2 seconds.  Psychiatric: She has a normal mood and affect.  Nursing note and vitals reviewed.  LOWER EXTREMITY EXAM: Left  INSPECTION & PALPATION: No gross deformity. Moderate tenderness over left greater trochanter/lateral hip. Leg mildly shortened and internally rotated. No open wounds.  SENSORY: sensation is intact to  light touch in:  Superficial peroneal nerve distribution (over dorsum of foot) Deep peroneal nerve distribution (over first dorsal web space) Sural nerve distribution (over lateral aspect 5th metatarsal) Saphenous nerve distribution (over medial instep)  MOTOR:  + Motor EHL (great toe dorsiflexion) + FHL (great toe plantar flexion)  + TA (ankle dorsiflexion)  + GSC (ankle plantar flexion)  VASCULAR: 2+ dorsalis pedis and posterior tibialis pulses Capillary refill < 2 sec, toes warm and well-perfused  COMPARTMENTS: Soft, warm, well-perfused No pain with passive extension No parethesias    ED Treatments / Results  Labs (all labs ordered are listed, but only abnormal results are displayed) Labs Reviewed  CBC WITH DIFFERENTIAL/PLATELET - Abnormal; Notable for the following:       Result Value   WBC 15.6 (*)    Lymphs Abs 8.0 (*)    All other components within normal limits  BASIC METABOLIC PANEL - Abnormal; Notable for the following:    Glucose, Bld 126 (*)    GFR calc non Af Amer 52 (*)    GFR calc Af Amer 60 (*)  All other components within normal limits  URINALYSIS, ROUTINE W REFLEX MICROSCOPIC (NOT AT [facility]) - Abnormal; Notable for the following:    Ketones, ur 15 (*)    Leukocytes, UA SMALL (*)    All other components within normal limits  TROPONIN I - Abnormal; Notable for the following:    Troponin I 0.10 (*)    All other components within normal limits  HEPATIC FUNCTION PANEL - Abnormal; Notable for the following:    Total Protein 6.0 (*)    ALT 11 (*)    All other components within normal limits  BRAIN NATRIURETIC PEPTIDE - Abnormal; Notable for the following:    B Natriuretic Peptide 179.2 (*)    All other components within normal limits  URINE MICROSCOPIC-ADD ON - Abnormal; Notable for the following:    Squamous Epithelial / LPF 0-5 (*)    Bacteria, UA MANY (*)    All other components within normal limits  PATHOLOGIST SMEAR REVIEW  BASIC METABOLIC PANEL   CBC    EKG  EKG Interpretation  Date/Time:  Tuesday April 16 2016 10:12:58 EST Ventricular Rate:  69 PR Interval:    QRS Duration: 97 QT Interval:  425 QTC Calculation: 456 R Axis:   -32 Text Interpretation:  Sinus rhythm Atrial premature complex Left ventricular hypertrophy Anterior infarct, old No significant change since last tracing Confirmed by Zakhari Fogel MD, Ayrianna Mcginniss [phone]) on 04/16/2016 7:37:29 PM       Radiology Dg Chest 2 View  Result Date: 04/16/2016 CLINICAL DATA:  Unwitnessed fall today with patient found on the floor. No cardiopulmonary complaints. History of diabetes, hypertension, atrial fibrillation. EXAM: CHEST  2 VIEW COMPARISON:  None in PACs FINDINGS: The lungs are mildly hypoinflated. The interstitial markings are coarse in the left lower lobe. The cardiac silhouette is enlarged. The central pulmonary vascularity is engorged. There is calcification in the wall of the aortic arch. There is no pleural effusion. The bony thorax exhibits no acute abnormality. IMPRESSION: Cardiomegaly with pulmonary vascular congestion suggests low-grade compensated CHF. Left lower lobe subsegmental atelectasis is suspected. Aortic atherosclerosis. Electronically Signed   By: David  Martinique M.D.   On: 04/16/2016 10:07   Ct Head Wo Contrast  Result Date: 04/16/2016 CLINICAL DATA:  Fall EXAM: CT HEAD WITHOUT CONTRAST CT CERVICAL SPINE WITHOUT CONTRAST TECHNIQUE: Multidetector CT imaging of the head and cervical spine was performed following the standard protocol without intravenous contrast. Multiplanar CT image reconstructions of the cervical spine were also generated. COMPARISON:  None. FINDINGS: CT HEAD FINDINGS Brain: Bifrontal extra-axial CSF fluid collections measuring 12 mm in thickness bilaterally. Prominent CSF also noted around the cerebellum bilaterally. No evidence of acute high-density hemorrhage. Ventricle size normal. Mild shift of the midline structures to the left approximately  2 mm. Mild chronic microvascular ischemic change in the white matter. No acute infarct or mass. Vascular: No hyperdense vessel or unexpected calcification. Skull: Fracture of the right orbital floor of indeterminate age. No fluid in the sinuses. CT face recommended if there is acute facial trauma. Otherwise no skull fracture. Sinuses/Orbits: Mastoid sinus effusion on the left with air-fluid level. Middle ear effusion on the left. Right mastoid sinus clear. Mucosal thickening and chronic bony thickening of the sphenoid sinus. Remaining paranasal sinuses clear. Other: None CT CERVICAL SPINE FINDINGS Alignment: Slight anterior slip C7-T1. Straightening of the cervical lordosis. Skull base and vertebrae: Negative for cervical spine fracture. Soft tissues and spinal canal: Negative for mass or adenopathy. Mild carotid artery calcification. Disc levels:  C2-3:  Negative C3-4: Disc degeneration and spondylosis. Bilateral facet degeneration. Mild spinal stenosis and moderate right foraminal encroachment. C4-5: Disc degeneration with uncinate spurring. Facet degeneration the left. No significant stenosis C5-6: Disc degeneration with diffuse uncinate spurring. Mild facet degeneration. Mild canal stenosis and mild foraminal stenosis bilaterally. C6-7: Disc degeneration with diffuse uncinate spurring. Mild spinal stenosis and mild foraminal narrowing bilaterally C7-T1: Mild anterior slip due to bilateral facet degeneration. No significant stenosis. Upper chest: Lung apices clear. Other: None IMPRESSION: 12 mm subdural hygroma bilaterally. This could be due to acute injury or could be chronic. Prominent CSF around the cerebellum bilaterally. No prior study. No evidence of acute intracranial hemorrhage. Mild chronic microvascular ischemic change in the white matter. No acute infarct. Right orbital floor fracture of indeterminate age. There is no fluid in the sinuses. If the patient has acute facial trauma, CT face recommended for  further evaluation Cervical spondylosis.  Negative for fracture of the cervical spine. Electronically Signed   By: Franchot Gallo M.D.   On: 04/16/2016 09:57   Ct Cervical Spine Wo Contrast  Result Date: 04/16/2016 CLINICAL DATA:  Fall EXAM: CT HEAD WITHOUT CONTRAST CT CERVICAL SPINE WITHOUT CONTRAST TECHNIQUE: Multidetector CT imaging of the head and cervical spine was performed following the standard protocol without intravenous contrast. Multiplanar CT image reconstructions of the cervical spine were also generated. COMPARISON:  None. FINDINGS: CT HEAD FINDINGS Brain: Bifrontal extra-axial CSF fluid collections measuring 12 mm in thickness bilaterally. Prominent CSF also noted around the cerebellum bilaterally. No evidence of acute high-density hemorrhage. Ventricle size normal. Mild shift of the midline structures to the left approximately 2 mm. Mild chronic microvascular ischemic change in the white matter. No acute infarct or mass. Vascular: No hyperdense vessel or unexpected calcification. Skull: Fracture of the right orbital floor of indeterminate age. No fluid in the sinuses. CT face recommended if there is acute facial trauma. Otherwise no skull fracture. Sinuses/Orbits: Mastoid sinus effusion on the left with air-fluid level. Middle ear effusion on the left. Right mastoid sinus clear. Mucosal thickening and chronic bony thickening of the sphenoid sinus. Remaining paranasal sinuses clear. Other: None CT CERVICAL SPINE FINDINGS Alignment: Slight anterior slip C7-T1. Straightening of the cervical lordosis. Skull base and vertebrae: Negative for cervical spine fracture. Soft tissues and spinal canal: Negative for mass or adenopathy. Mild carotid artery calcification. Disc levels:  C2-3:  Negative C3-4: Disc degeneration and spondylosis. Bilateral facet degeneration. Mild spinal stenosis and moderate right foraminal encroachment. C4-5: Disc degeneration with uncinate spurring. Facet degeneration the left.  No significant stenosis C5-6: Disc degeneration with diffuse uncinate spurring. Mild facet degeneration. Mild canal stenosis and mild foraminal stenosis bilaterally. C6-7: Disc degeneration with diffuse uncinate spurring. Mild spinal stenosis and mild foraminal narrowing bilaterally C7-T1: Mild anterior slip due to bilateral facet degeneration. No significant stenosis. Upper chest: Lung apices clear. Other: None IMPRESSION: 12 mm subdural hygroma bilaterally. This could be due to acute injury or could be chronic. Prominent CSF around the cerebellum bilaterally. No prior study. No evidence of acute intracranial hemorrhage. Mild chronic microvascular ischemic change in the white matter. No acute infarct. Right orbital floor fracture of indeterminate age. There is no fluid in the sinuses. If the patient has acute facial trauma, CT face recommended for further evaluation Cervical spondylosis.  Negative for fracture of the cervical spine. Electronically Signed   By: Franchot Gallo M.D.   On: 04/16/2016 09:57   Dg Hip Unilat W Or Wo Pelvis 2-3 Views Left  Result Date: 04/16/2016 CLINICAL DATA:  Unwitnessed fall today. The patient reports left hip pain. History of previous ORIF for left hip fracture. EXAM: DG HIP (WITH OR WITHOUT PELVIS) 2-3V LEFT COMPARISON:  None in PACs FINDINGS: A telescoping screw with sideplate are present for fixation of a previous femoral neck fracture. No acute fracture is observed. The interface of the cortical screws with the native bone appears normal. There is mild narrowing of both hip joint spaces. There is diffuse osteopenia of the bony structures. The observed portions of the bony pelvis are normal. IMPRESSION: No acute left hip fracture is observed. Previous ORIF with sideplate and telescoping screw appears intact. The observed portions of the bony pelvis exhibit no acute fractures. Electronically Signed   By: David  Martinique M.D.   On: 04/16/2016 10:09    Procedures Procedures  (including critical care time)  Medications Ordered in ED Medications  fentaNYL (SUBLIMAZE) injection 25 mcg (25 mcg Intravenous Given 04/16/16 1022)  ondansetron (ZOFRAN) injection 4 mg (4 mg Intravenous Given 04/16/16 1022)  acetaminophen (TYLENOL) tablet 500-1,000 mg (not administered)  amLODipine (NORVASC) tablet 5 mg (not administered)  aspirin EC tablet 81 mg (not administered)  benazepril (LOTENSIN) tablet 20 mg (not administered)  polyethylene glycol (MIRALAX / GLYCOLAX) packet 17 g (not administered)  senna-docusate (Senokot-S) tablet 1 tablet (not administered)  metFORMIN (GLUCOPHAGE) tablet 1,000 mg (not administered)  magnesium hydroxide (MILK OF MAGNESIA) suspension 30 mL (not administered)  levothyroxine (SYNTHROID, LEVOTHROID) tablet 50 mcg (not administered)  donepezil (ARICEPT) tablet 10 mg (not administered)  sodium chloride flush (NS) 0.9 % injection 3 mL (not administered)  sodium chloride flush (NS) 0.9 % injection 3 mL (not administered)  0.9 %  sodium chloride infusion (not administered)  enoxaparin (LOVENOX) injection 40 mg (not administered)  furosemide (LASIX) injection 20 mg (not administered)  aspirin chewable tablet 324 mg (324 mg Oral Given 04/16/16 1220)  furosemide (LASIX) injection 20 mg (20 mg Intravenous Given 04/16/16 1430)     Initial Impression / Assessment and Plan / ED Course  I have reviewed the triage vital signs and the nursing notes.  Pertinent labs & imaging results that were available during my care of the patient were reviewed by me and considered in my medical decision making (see chart for details).  Clinical Course     82 year old female with past medical history as above who presents with left hip pain status post fall. Suspect mechanical fall although patient also has history of recent syncope 2 days ago. Will obtain plain films as well as screening lab work. Per EMS report, patient had sinus arrhythmia so will obtain EKG as well. No  signs of neurovascular compromise. Concern for hip fracture.   Plain films fortunately showed no fracture or malalignment. CT head is negative. Of note, patient noted to be intermittently hypoxic and chest x-ray shows hyperkalemia with cardiomegaly. BNP is elevated as well as troponin. EKG shows no acute ischemia. Concern for possible acute CHF exacerbation. No known history of CHF. Will admit for diuresis, PT/OT, and pain control.  Final Clinical Impressions(s) / ED Diagnoses   Final diagnoses:  Acute congestive heart failure, unspecified congestive heart failure type [facility])  Fall, initial encounter    New Prescriptions Current Discharge Medication List       Duffy Bruce, MD 04/16/16 [phone]

## 2016-04-16 NOTE — ED Notes (Addendum)
MD at bedside with family.  Family at bedside.

## 2016-04-16 NOTE — ED Notes (Signed)
Bed: WA13 Expected date:  Expected time:  Means of arrival:  Comments: EMS: Fall 

## 2016-04-16 NOTE — ED Notes (Signed)
Family at bedside. 

## 2016-04-16 NOTE — ED Notes (Signed)
Unable to collect labs patient is not in the room 

## 2016-04-16 NOTE — Progress Notes (Signed)
ED CM confirmed with admission nurse that CM consult entered in epic involved personal care services of [id number] and [facility] Admission nurse reports patient is being seen in the morning by [id number] in at night by [facility] staff

## 2016-04-16 NOTE — ED Notes (Signed)
Patient transported to CT 

## 2016-04-17 ENCOUNTER — Encounter (HOSPITAL_COMMUNITY): Payer: Self-pay | Admitting: Internal Medicine

## 2016-04-17 LAB — GLUCOSE, CAPILLARY
GLUCOSE-CAPILLARY: 221 mg/dL — AB (ref 65–99)
GLUCOSE-CAPILLARY: 145 mg/dL — AB (ref 65–99)
Glucose-Capillary: 119 mg/dL — ABNORMAL HIGH (ref 65–99)
Glucose-Capillary: 340 mg/dL — ABNORMAL HIGH (ref 65–99)
Glucose-Capillary: 184 mg/dL — ABNORMAL HIGH (ref 65–99)

## 2016-04-17 LAB — BASIC METABOLIC PANEL
Anion gap: 7 (ref 5–15)
BUN: 15 mg/dL (ref 6–20)
CHLORIDE: 103 mmol/L (ref 101–111)
CO2: 30 mmol/L (ref 22–32)
CREATININE: 0.98 mg/dL (ref 0.44–1.00)
Calcium: 9.6 mg/dL (ref 8.9–10.3)
GFR calc non Af Amer: 46 mL/min — ABNORMAL LOW (ref >60)
GFR, EST AFRICAN AMERICAN: 54 mL/min — AB (ref >60)
Glucose, Bld: 119 mg/dL — ABNORMAL HIGH (ref 65–99)
Potassium: 3.4 mmol/L — ABNORMAL LOW (ref 3.5–5.1)
Sodium: 140 mmol/L (ref 135–145)

## 2016-04-17 LAB — CBC
HCT: 39.3 % (ref 36.0–46.0)
Hemoglobin: 13.3 g/dL (ref 12.0–15.0)
MCH: 32.5 pg (ref 26.0–34.0)
MCHC: 33.8 g/dL (ref 30.0–36.0)
MCV: 96.1 fL (ref 78.0–100.0)
PLATELETS: 165 10*3/uL (ref 150–400)
RBC: 4.09 MIL/uL (ref 3.87–5.11)
RDW: 13.4 % (ref 11.5–15.5)
WBC: 13.6 10*3/uL — ABNORMAL HIGH (ref 4.0–10.5)

## 2016-04-17 MED ORDER — POTASSIUM CHLORIDE CRYS ER 20 MEQ PO TBCR
20.0000 meq | EXTENDED_RELEASE_TABLET | Freq: Once | ORAL | Status: AC
  Administered 2016-04-17: 20 meq via ORAL
  Filled 2016-04-17: qty 1

## 2016-04-17 MED ORDER — INSULIN ASPART 100 UNIT/ML ~~LOC~~ SOLN
0.0000 [IU] | Freq: Three times a day (TID) | SUBCUTANEOUS | Status: DC
  Administered 2016-04-18: 1 [IU] via SUBCUTANEOUS
  Administered 2016-04-18: 3 [IU] via SUBCUTANEOUS

## 2016-04-17 NOTE — Progress Notes (Signed)
Inpatient Diabetes Program Recommendations  AACE/ADA: New Consensus Statement on Inpatient Glycemic Control (2015)  Target Ranges:  Prepandial:   less than 140 mg/dL      Peak postprandial:   less than 180 mg/dL (1-2 hours)      Critically ill patients:  140 - 180 mg/dL   Results for Jessica Daniel, Jessica Daniel (MRN [id number]) as of 04/17/2016 13:43  Ref. Range 04/17/2016 07:54 04/17/2016 11:43 04/17/2016 12:50  Glucose-Capillary Latest Ref Range: 65 - 99 mg/dL 119 (H) 221 (H) 340 (H)    Admit with: Fall  History: DM2, Dementia  Home DM Meds: Metformin 1000 mg daily  Current Insulin Orders: Metformin 1000 mg daily     MD- Please consider the following in-hospital insulin adjustments:  Start Novolog Sensitive Correction Scale/ SSI (0-9 units) TID AC + HS      --Will follow patient during hospitalization--  Wyn Quaker RN, MSN, CDE Diabetes Coordinator Inpatient Glycemic Control Team Team Pager: [phone] (8a-5p)

## 2016-04-17 NOTE — Evaluation (Signed)
Physical Therapy Evaluation Patient Details Name: CORKY TANENBAUM MRN: [id number] DOB: 12/07/1915 Today's Date: 04/17/2016   History of Present Illness  MARQUI DROSS is a 82 y.o. female with medical history significant of dementia, diabetes mellitus and hypertension who lives at home with her granddaughter , Tenna Delaine. Patient fell overnight on 04/16/16.landing on her left side.  negative for hip fracture.  A chest x-ray noted some mild bilateral effusions consistent with mild CHF  Clinical Impression  The  Patient is not verbal, requires  Cues for activity, granddaughter states that the patient takes herself to the  Bathroom PTA. The patient is mobil;izing, ambulated x 90' with RW.. Pt admitted with above diagnosis. Pt currently with functional limitations due to the deficits listed below (see PT Problem List). Pt will benefit from skilled PT to increase their independence and safety with mobility to allow discharge to the venue listed below.       Follow Up Recommendations Home health PT;Supervision/Assistance - 24 hour (unless she returns to [facility] [place]    Equipment Recommendations  None recommended by PT    Recommendations for Other Services       Precautions / Restrictions Precautions Precautions: Fall Precaution Comments: incontinence, wears adult briefs. Does well to  potty every four hours per G-daughter      Mobility  Bed Mobility Overal bed mobility: Needs Assistance Bed Mobility: Supine to Sit     Supine to sit: Supervision     General bed mobility comments: multimodal cues to initiate, tactile cues to follow through  Transfers Overall transfer level: Needs assistance Equipment used: Rolling walker (2 wheeled) Transfers: Sit to/from Stand Sit to Stand: Supervision         General transfer comment: multimodal cues to initiate standing from bed and BSC.   Ambulation/Gait Ambulation/Gait assistance: Min guard Ambulation Distance (Feet): 90 Feet  (then 25) Assistive device: Rolling walker (2 wheeled) Gait Pattern/deviations: Step-to pattern;Step-through pattern     General Gait Details: multimodal cues to turn around, did not follow verbal command. Patient indicated  that she wanted to sit down by approaching a chair in the hall wghil walking. Allowed patiewnt to sit down. Appeared fatigued. Sats 94% on RA and HR 93.   Stairs            Wheelchair Mobility    Modified Rankin (Stroke Patients Only)       Balance Overall balance assessment: History of Falls;Needs assistance Sitting-balance support: No upper extremity supported;Feet supported Sitting balance-Leahy Scale: Good     Standing balance support: During functional activity;No upper extremity supported;Bilateral upper extremity supported Standing balance-Leahy Scale: Fair                               Pertinent Vitals/Pain Pain Assessment: Faces Faces Pain Scale: No hurt    Home Living Family/patient expects to be discharged to:: Private residence Living Arrangements: Other relatives Available Help at Discharge: Family;Personal care attendant Type of Home: House Home Access: Stairs to enter   [employer] of Steps: 1 Home Layout: One level Home Equipment: Walker - 2 wheels;Walker - 4 wheels;Wheelchair - manual Additional Comments: lives with granddaughter, has CNA in AM and PM, attends [facility] due=ring the day.     Prior Function Level of Independence: Needs assistance   Gait / Transfers Assistance Needed: independent with RW in home., gets self to bathroom.  ADL's / Homemaking Assistance Needed: CNA assists with  bath        Hand Dominance        Extremity/Trunk Assessment   Upper Extremity Assessment: Generalized weakness           Lower Extremity Assessment: Generalized weakness         Communication      Cognition Arousal/Alertness: Awake/alert Behavior During Therapy: WFL for tasks  assessed/performed Overall Cognitive Status: History of cognitive impairments - at baseline Area of Impairment: Following commands     Memory: Decreased short-term memory Following Commands: Follows one step commands with increased time            General Comments      Exercises     Assessment/Plan    PT Assessment Patient needs continued PT services  PT Problem List Decreased strength;Decreased mobility;Decreased balance;Decreased cognition          PT Treatment Interventions DME instruction;Gait training;Functional mobility training;Therapeutic activities;Therapeutic exercise;Patient/family education    PT Goals (Current goals can be found in the Care Plan section)  Acute Rehab PT Goals Patient Stated Goal: to  be able tto walk anfd go to the  bathroom. PT Goal Formulation: With family Time For Goal Achievement: 05/01/16 Potential to Achieve Goals: Good    Frequency Min 3X/week   Barriers to discharge   per granddaughter, she is not working at present    Co-evaluation               End of Session   Activity Tolerance: Patient tolerated treatment well Patient left: in chair;with call bell/phone within reach;with chair alarm set;with family/visitor present Nurse Communication: Mobility status    Functional Assessment Tool Used: clinical judgement Functional Limitation: Mobility: Walking and moving around Mobility: Walking and Moving Around Current Status [phone]): At least 1 percent but less than 20 percent impaired, limited or restricted Mobility: Walking and Moving Around Goal Status [phone]): 0 percent impaired, limited or restricted    Time: 0905-0940 PT Time Calculation (min) (ACUTE ONLY): 35 min   Charges:         PT G Codes:   PT G-Codes **NOT FOR INPATIENT CLASS** Functional Assessment Tool Used: clinical judgement Functional Limitation: Mobility: Walking and moving around Mobility: Walking and Moving Around Current Status [id number]): At  least 1 percent but less than 20 percent impaired, limited or restricted Mobility: Walking and Moving Around Goal Status [phone]): 0 percent impaired, limited or restricted    Claretha Cooper 04/17/2016, 10:03 AM Tresa Endo PT [phone]

## 2016-04-17 NOTE — Progress Notes (Signed)
PROGRESS NOTE    Jessica Daniel  [id number] DOB: 1916/03/07 DOA: 04/16/2016 PCP: Sherian Maroon, MD    Brief Narrative:  82 year old female brought in for fall and was found to be short of breath and admitted for CHF. EF is not known. Patient was placed on Lasix 20 mg IV. On my exam today patient appears calm and is sleeping on the bed without any difficulty. Denies any shortness of breath or chest pain. Physical therapy notes noted.   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 Acute diastolic CHF - appears improved at this time. Will continue with Lasix 20 mg IV daily. Intake output noted. Will replace potassium 20 mg now and recheck metabolic panel in a.m. If patient continues to be stable to go home tomorrow with physical therapy. #2. Diabetes mellitus type 2 - I have noted CBG's. Will place patient on sliding scale coverage. #3. Hypertension - continue home medications. #4. Fall - CT scan shows a right orbital floor fracture. Age indeterminant. Externally a don't see any obvious injury. Continue to observe and appreciate physical therapy notes. Will go home tomorrow if patient continues to be stable with physical therapy. #5. Dementia - no acute issues.   DVT prophylaxis: Lovenox. Code Status: DO NOT RESUSCITATE. Family Communication: No family at the bedside. Disposition Plan: Home tomorrow with PT.   Consultants:   None.  Procedures: None.  Antimicrobials: None.    Subjective: I'm feeling better.  Objective: Vitals:   04/16/16 1811 04/16/16 2051 04/17/16 0541 04/17/16 1300  BP: (!) 147/62 133/62 (!) 156/77 (!) 152/88  Pulse: 65 64 79 82  Resp: 18 18 18 18   Temp: 97.8 F (36.6 C) 97.8 F (36.6 C) 98.3 F (36.8  C) 98.3 F (36.8 C)  TempSrc: Oral Oral Oral Oral  SpO2: 94% 94% 93% 96%  Weight: 74.4 kg (164 lb 0.4 oz)  74.4 kg (164 lb 0.4 oz)   Height: 5\' 7"  (1.702 m)       Intake/Output Summary (Last 24 hours) at 04/17/16 2156 Last data filed at 04/17/16 0930  Gross per 24 hour  Intake              330 ml  Output                0 ml  Net              330 ml   Filed Weights   04/16/16 1811 04/17/16 0541  Weight: 74.4 kg (164 lb 0.4 oz) 74.4 kg (164 lb 0.4 oz)    Examination:  General exam: Appears calm and comfortable  Respiratory system: Clear to auscultation. Respiratory effort normal. Cardiovascular system: S1 & S2 heard, RRR. No JVD, murmurs, rubs, gallops or clicks. No pedal edema. Gastrointestinal system: Abdomen is nondistended, soft and nontender. No organomegaly or masses felt. Normal bowel sounds heard. Central nervous system: Alert and oriented to name. No focal neurological deficits. Extremities: Symmetric 5 x 5 power. Skin: No rashes, lesions or ulcers Psychiatry: Oriented to name only. Follows commands moves all extremity.    Data Reviewed: I have personally reviewed following labs and imaging studies  CBC:  Recent Labs Lab 04/16/16 1004 04/17/16 0459  WBC 15.6* 13.6*  NEUTROABS 6.6  --  HGB 13.7 13.3  HCT 39.6 39.3  MCV 93.4 96.1  PLT 163 [id number]   Basic Metabolic Panel:  Recent Labs Lab 04/16/16 1004 04/17/16 0459  NA 140 140  K 3.6 3.4*  CL 104 103  CO2 29 30  GLUCOSE 126* 119*  BUN 15 15  CREATININE 0.90 0.98  CALCIUM 10.2 9.6   GFR: Estimated Creatinine Clearance: 33.7 mL/min (by C-G formula based on SCr of 0.98 mg/dL). Liver Function Tests:  Recent Labs Lab 04/16/16 1004  AST 18  ALT 11*  ALKPHOS 44  BILITOT 0.8  PROT 6.0*  ALBUMIN 3.9   No results for input(s): LIPASE, AMYLASE in the last 168 hours. No results for input(s): AMMONIA in the last 168 hours. Coagulation Profile: No results for input(s): INR, PROTIME in the last 168  hours. Cardiac Enzymes:  Recent Labs Lab 04/16/16 1004  TROPONINI 0.10*   BNP (last 3 results) No results for input(s): PROBNP in the last 8760 hours. HbA1C: No results for input(s): HGBA1C in the last 72 hours. CBG:  Recent Labs Lab 04/16/16 2143 04/17/16 0754 04/17/16 1143 04/17/16 1250 04/17/16 1644  GLUCAP 248* 119* 221* 340* 184*   Lipid Profile: No results for input(s): CHOL, HDL, LDLCALC, TRIG, CHOLHDL, LDLDIRECT in the last 72 hours. Thyroid Function Tests: No results for input(s): TSH, T4TOTAL, FREET4, T3FREE, THYROIDAB in the last 72 hours. Anemia Panel: No results for input(s): VITAMINB12, FOLATE, FERRITIN, TIBC, IRON, RETICCTPCT in the last 72 hours. Sepsis Labs: No results for input(s): PROCALCITON, LATICACIDVEN in the last 168 hours.  No results found for this or any previous visit (from the past 240 hour(s)).       Radiology Studies: Dg Chest 2 View  Result Date: 04/16/2016 CLINICAL DATA:  Unwitnessed fall today with patient found on the floor. No cardiopulmonary complaints. History of diabetes, hypertension, atrial fibrillation. EXAM: CHEST  2 VIEW COMPARISON:  None in PACs FINDINGS: The lungs are mildly hypoinflated. The interstitial markings are coarse in the left lower lobe. The cardiac silhouette is enlarged. The central pulmonary vascularity is engorged. There is calcification in the wall of the aortic arch. There is no pleural effusion. The bony thorax exhibits no acute abnormality. IMPRESSION: Cardiomegaly with pulmonary vascular congestion suggests low-grade compensated CHF. Left lower lobe subsegmental atelectasis is suspected. Aortic atherosclerosis. Electronically Signed   By: David  Martinique M.D.   On: 04/16/2016 10:07   Ct Head Wo Contrast  Result Date: 04/16/2016 CLINICAL DATA:  Fall EXAM: CT HEAD WITHOUT CONTRAST CT CERVICAL SPINE WITHOUT CONTRAST TECHNIQUE: Multidetector CT imaging of the head and cervical spine was performed following the  standard protocol without intravenous contrast. Multiplanar CT image reconstructions of the cervical spine were also generated. COMPARISON:  None. FINDINGS: CT HEAD FINDINGS Brain: Bifrontal extra-axial CSF fluid collections measuring 12 mm in thickness bilaterally. Prominent CSF also noted around the cerebellum bilaterally. No evidence of acute high-density hemorrhage. Ventricle size normal. Mild shift of the midline structures to the left approximately 2 mm. Mild chronic microvascular ischemic change in the white matter. No acute infarct or mass. Vascular: No hyperdense vessel or unexpected calcification. Skull: Fracture of the right orbital floor of indeterminate age. No fluid in the sinuses. CT face recommended if there is acute facial trauma. Otherwise no skull fracture. Sinuses/Orbits: Mastoid sinus effusion on the left with air-fluid level. Middle ear effusion on the left. Right mastoid sinus clear. Mucosal thickening and chronic bony thickening of the sphenoid sinus. Remaining paranasal sinuses clear. Other: None CT  CERVICAL SPINE FINDINGS Alignment: Slight anterior slip C7-T1. Straightening of the cervical lordosis. Skull base and vertebrae: Negative for cervical spine fracture. Soft tissues and spinal canal: Negative for mass or adenopathy. Mild carotid artery calcification. Disc levels:  C2-3:  Negative C3-4: Disc degeneration and spondylosis. Bilateral facet degeneration. Mild spinal stenosis and moderate right foraminal encroachment. C4-5: Disc degeneration with uncinate spurring. Facet degeneration the left. No significant stenosis C5-6: Disc degeneration with diffuse uncinate spurring. Mild facet degeneration. Mild canal stenosis and mild foraminal stenosis bilaterally. C6-7: Disc degeneration with diffuse uncinate spurring. Mild spinal stenosis and mild foraminal narrowing bilaterally C7-T1: Mild anterior slip due to bilateral facet degeneration. No significant stenosis. Upper chest: Lung apices  clear. Other: None IMPRESSION: 12 mm subdural hygroma bilaterally. This could be due to acute injury or could be chronic. Prominent CSF around the cerebellum bilaterally. No prior study. No evidence of acute intracranial hemorrhage. Mild chronic microvascular ischemic change in the white matter. No acute infarct. Right orbital floor fracture of indeterminate age. There is no fluid in the sinuses. If the patient has acute facial trauma, CT face recommended for further evaluation Cervical spondylosis.  Negative for fracture of the cervical spine. Electronically Signed   By: Franchot Gallo M.D.   On: 04/16/2016 09:57   Ct Cervical Spine Wo Contrast  Result Date: 04/16/2016 CLINICAL DATA:  Fall EXAM: CT HEAD WITHOUT CONTRAST CT CERVICAL SPINE WITHOUT CONTRAST TECHNIQUE: Multidetector CT imaging of the head and cervical spine was performed following the standard protocol without intravenous contrast. Multiplanar CT image reconstructions of the cervical spine were also generated. COMPARISON:  None. FINDINGS: CT HEAD FINDINGS Brain: Bifrontal extra-axial CSF fluid collections measuring 12 mm in thickness bilaterally. Prominent CSF also noted around the cerebellum bilaterally. No evidence of acute high-density hemorrhage. Ventricle size normal. Mild shift of the midline structures to the left approximately 2 mm. Mild chronic microvascular ischemic change in the white matter. No acute infarct or mass. Vascular: No hyperdense vessel or unexpected calcification. Skull: Fracture of the right orbital floor of indeterminate age. No fluid in the sinuses. CT face recommended if there is acute facial trauma. Otherwise no skull fracture. Sinuses/Orbits: Mastoid sinus effusion on the left with air-fluid level. Middle ear effusion on the left. Right mastoid sinus clear. Mucosal thickening and chronic bony thickening of the sphenoid sinus. Remaining paranasal sinuses clear. Other: None CT CERVICAL SPINE FINDINGS Alignment: Slight  anterior slip C7-T1. Straightening of the cervical lordosis. Skull base and vertebrae: Negative for cervical spine fracture. Soft tissues and spinal canal: Negative for mass or adenopathy. Mild carotid artery calcification. Disc levels:  C2-3:  Negative C3-4: Disc degeneration and spondylosis. Bilateral facet degeneration. Mild spinal stenosis and moderate right foraminal encroachment. C4-5: Disc degeneration with uncinate spurring. Facet degeneration the left. No significant stenosis C5-6: Disc degeneration with diffuse uncinate spurring. Mild facet degeneration. Mild canal stenosis and mild foraminal stenosis bilaterally. C6-7: Disc degeneration with diffuse uncinate spurring. Mild spinal stenosis and mild foraminal narrowing bilaterally C7-T1: Mild anterior slip due to bilateral facet degeneration. No significant stenosis. Upper chest: Lung apices clear. Other: None IMPRESSION: 12 mm subdural hygroma bilaterally. This could be due to acute injury or could be chronic. Prominent CSF around the cerebellum bilaterally. No prior study. No evidence of acute intracranial hemorrhage. Mild chronic microvascular ischemic change in the white matter. No acute infarct. Right orbital floor fracture of indeterminate age. There is no fluid in the sinuses. If the patient has acute facial trauma, CT face recommended  for further evaluation Cervical spondylosis.  Negative for fracture of the cervical spine. Electronically Signed   By: Franchot Gallo M.D.   On: 04/16/2016 09:57   Dg Hip Unilat W Or Wo Pelvis 2-3 Views Left  Result Date: 04/16/2016 CLINICAL DATA:  Unwitnessed fall today. The patient reports left hip pain. History of previous ORIF for left hip fracture. EXAM: DG HIP (WITH OR WITHOUT PELVIS) 2-3V LEFT COMPARISON:  None in PACs FINDINGS: A telescoping screw with sideplate are present for fixation of a previous femoral neck fracture. No acute fracture is observed. The interface of the cortical screws with the native  bone appears normal. There is mild narrowing of both hip joint spaces. There is diffuse osteopenia of the bony structures. The observed portions of the bony pelvis are normal. IMPRESSION: No acute left hip fracture is observed. Previous ORIF with sideplate and telescoping screw appears intact. The observed portions of the bony pelvis exhibit no acute fractures. Electronically Signed   By: David  Martinique M.D.   On: 04/16/2016 10:09        Scheduled Meds: . amLODipine  5 mg Oral Daily  . aspirin EC  81 mg Oral Daily  . benazepril  20 mg Oral Daily  . donepezil  10 mg Oral QHS  . enoxaparin (LOVENOX) injection  40 mg Subcutaneous Q24H  . furosemide  20 mg Intravenous Daily  . levothyroxine  50 mcg Oral QAC breakfast  . metFORMIN  1,000 mg Oral Q breakfast  . senna-docusate  1 tablet Oral BID  . sodium chloride flush  3 mL Intravenous Q12H   Continuous Infusions:   LOS: 1 day       Rise Patience., MD [facility] Pager [phone]  If 7PM-7AM, please contact night-coverage [email or web address] Password [facility] 04/17/2016, 9:56 PM

## 2016-04-18 DIAGNOSIS — E119 Type 2 diabetes mellitus without complications: Secondary | ICD-10-CM

## 2016-04-18 DIAGNOSIS — I10 Essential (primary) hypertension: Secondary | ICD-10-CM

## 2016-04-18 DIAGNOSIS — W19XXXA Unspecified fall, initial encounter: Secondary | ICD-10-CM

## 2016-04-18 DIAGNOSIS — G309 Alzheimer's disease, unspecified: Secondary | ICD-10-CM

## 2016-04-18 DIAGNOSIS — I5031 Acute diastolic (congestive) heart failure: Secondary | ICD-10-CM

## 2016-04-18 DIAGNOSIS — C9110 Chronic lymphocytic leukemia of B-cell type not having achieved remission: Secondary | ICD-10-CM

## 2016-04-18 DIAGNOSIS — F0280 Dementia in other diseases classified elsewhere without behavioral disturbance: Secondary | ICD-10-CM

## 2016-04-18 LAB — BASIC METABOLIC PANEL
ANION GAP: 10 (ref 5–15)
BUN: 17 mg/dL (ref 6–20)
CALCIUM: 9.6 mg/dL (ref 8.9–10.3)
CO2: 28 mmol/L (ref 22–32)
Chloride: 103 mmol/L (ref 101–111)
Creatinine, Ser: 1.18 mg/dL — ABNORMAL HIGH (ref 0.44–1.00)
GFR, EST AFRICAN AMERICAN: 43 mL/min — AB (ref >60)
GFR, EST NON AFRICAN AMERICAN: 37 mL/min — AB (ref >60)
Glucose, Bld: 144 mg/dL — ABNORMAL HIGH (ref 65–99)
POTASSIUM: 3.5 mmol/L (ref 3.5–5.1)
Sodium: 141 mmol/L (ref 135–145)

## 2016-04-18 LAB — GLUCOSE, CAPILLARY
GLUCOSE-CAPILLARY: 230 mg/dL — AB (ref 65–99)
Glucose-Capillary: 142 mg/dL — ABNORMAL HIGH (ref 65–99)

## 2016-04-18 MED ORDER — ENOXAPARIN SODIUM 30 MG/0.3ML ~~LOC~~ SOLN: 30.0000 mg | SUBCUTANEOUS | Status: DC

## 2016-04-18 NOTE — Progress Notes (Signed)
Physical Therapy Treatment Patient Details Name: Jessica Daniel MRN: [id number] DOB: May 18, 1916 Today's Date: 04/18/2016    History of Present Illness Jessica Daniel is a 82 y.o. female with medical history significant of dementia, diabetes mellitus and hypertension who lives at home with her granddaughter , Jessica Daniel. Patient fell overnight on 04/16/16.landing on her left side.  negative for hip fracture.  A chest x-ray noted some mild bilateral effusions consistent with mild CHF    PT Comments    Pt responds to "Jessica Daniel".  Granddaughter in room and very helpful.  Assisted out of recliner to amb to bathroom then in hallway using walker.  Required occasional VC's for safety.  Tolerated increased distance.    Follow Up Recommendations   [facility]     Equipment Recommendations       Recommendations for Other Services       Precautions / Restrictions Precautions Precautions: Fall Precaution Comments: incontinence, wears adult briefs. Does well to  potty every four hours per G-daughter Restrictions Weight Bearing Restrictions: No    Mobility  Bed Mobility               General bed mobility comments: NT OOB in recliner  Transfers Overall transfer level: Needs assistance Equipment used: Rolling walker (2 wheeled) Transfers: Sit to/from [employer] Transfers Sit to Stand: Supervision;Min guard Stand pivot transfers: Supervision;Min guard       General transfer comment: occassional VC's for safety negociating walker in tight spaces.  Ambulation/Gait Ambulation/Gait assistance: Min guard Ambulation Distance (Feet): 125 Feet Assistive device: Rolling walker (2 wheeled) Gait Pattern/deviations: Step-to pattern;Step-through pattern Gait velocity: WFL   General Gait Details: occassional functional VC's for safety with walker esp turns and in tight spaces.  Tolerated increased distance.    Stairs            Wheelchair Mobility    Modified Rankin (Stroke  Patients Only)       Balance                                    Cognition Arousal/Alertness: Awake/alert Behavior During Therapy: WFL for tasks assessed/performed Overall Cognitive Status: History of cognitive impairments - at baseline                 General Comments: follows functional commands    Exercises      General Comments        Pertinent Vitals/Pain Pain Assessment: No/denies pain    Home Living                      Prior Function            PT Goals (current goals can now be found in the care plan section)         Time: [id number] PT Time Calculation (min) (ACUTE ONLY): 18 min  Charges:  $Gait Training: 8-22 mins                    G Codes:      Rica Koyanagi  PTA [facility]  Acute  Rehab Pager      [phone]

## 2016-04-18 NOTE — Care Management Note (Signed)
Case Management Note  Patient Details  Name: Jessica Daniel MRN: [id number] Date of Birth: Dec 29, 1915  Subjective/Objective:PT-recc HHPT-spoke to [facility]-they have own HHPT;faxed to fax#[phone] w/confirmation. Patient has 34hr caregivers. dtr will take home w/own transp.                    Action/Plan:d/c home w/HHPT @ [facility].   Expected Discharge Date:   (unknown)               Expected Discharge Plan:  [place]  In-House Referral:     Discharge planning Services  CM Consult  Post Acute Care Choice:  Resumption of Svcs/PTA Provider (private caregivers 24hr/dy x 7days;[facility] program-adult day care) Choice offered to:  Adult Children  DME Arranged:    DME Agency:     HH Arranged:  PT HH Agency:   ([facility] has own HHPT)  Status of Service:  Completed, signed off  If discussed at [place] of Stay Meetings, dates discussed:    Additional Comments:  Dessa Phi, RN 04/18/2016, 11:43 AM

## 2016-04-18 NOTE — Discharge Summary (Signed)
Physician Discharge Summary  Jessica Daniel [id number] DOB: [birth year] DOA: 04/16/2016  PCP: Sherian Maroon, MD  Admit date: 04/16/2016 Discharge date: 04/18/2016  Admitted From: Home Disposition: Home with Home Health PT  Recommendations for Outpatient Follow-up:  1. Follow up with PCP in 1-2 weeks 2. Please obtain BMP/CBC in one week  Home Health:  Yes Equipment/Devices: None  Discharge Condition: Stable CODE STATUS: DNR Diet recommendation: Heart Healthy and Carb Modified   Brief/Interim Summary: 82 year old female with CLL, Dementia, HTN, DM and other comorbids brought in for fall and was found to be short of breath and admitted for CHF. EF is not known and No Echo was done as granddaughters agreement for nothing aggressive or agreement with Admitting Physician. Patient was placed on Lasix 20 mg IV. She improved with Diuresis and will be D/C'd home as she was deemed medically stable and ready for D/C.   Discharge Diagnoses:  Principal Problem:   Acute diastolic heart failure (HCC) Active Problems:   Diabetes mellitus without complication (HCC)   Hypertension   CLL (chronic lymphocytic leukemia) (HCC)   Fall   Dementia of the Alzheimer's type without behavioral disturbance  Acute diastolic heart failure ([place]): Improved with IV Diuresis. No echo as per granddaughter's agreement for nothing aggressive or invasive. Stable for D/C. Follow up with PCP.   Hypertension: Continue home medications  CLL (chronic lymphocytic leukemia): Follow up with PCP  Fall: PT evaluated and recommended Home Health. CT showes Right Orbital Floor Fracture but talking with Family it is not new.   Dementia of the Alzheimer's type without behavioral disturbance: Continue Aricept  Elevated Troponin: Likely in the setting of mild heart failure. No evidence of ischemia  Leukocytosis: Likley related to her CLL. No signs of infection. Chest x-ray and urine unrevealing. Repeat labs in the  morning. No fever. previous labs we have are from 4 years ago  Hypokalemia: resolved and improved  Diabetes Mellitus Type 2: Continue Home Metformin  Subdural Hygromas: Possibly from Trauma but could be Chronic; No aggressive Interventions per family.   Discharge Instructions  Discharge Instructions    Call MD for:  difficulty breathing, headache or visual disturbances    Complete by:  As directed    Call MD for:  persistant dizziness or light-headedness    Complete by:  As directed    Call MD for:  persistant nausea and vomiting    Complete by:  As directed    Call MD for:  severe uncontrolled pain    Complete by:  As directed    Diet - low sodium heart healthy    Complete by:  As directed    Discharge instructions    Complete by:  As directed    Follow up at [facility] and take all medications as prescribed. Continue to work with Physical Therapy. If symptoms change or worsen please return to ER for evaluation.   Increase activity slowly    Complete by:  As directed        Medication List    TAKE these medications   acetaminophen 500 MG tablet Commonly known as:  TYLENOL Take 500-1,000 mg by mouth every 6 (six) hours as needed for moderate pain.   amLODipine 5 MG tablet Commonly known as:  NORVASC Take 5 mg by mouth daily.   aspirin EC 81 MG tablet Take 81 mg by mouth daily.   benazepril 20 MG tablet Commonly known as:  LOTENSIN Take 20 mg by mouth daily.   CALCIUM &  VIT D3 BONE HEALTH PO Take 1 tablet by mouth every evening.   donepezil 10 MG tablet Commonly known as:  ARICEPT Take 10 mg by mouth at bedtime.   levothyroxine 50 MCG tablet Commonly known as:  SYNTHROID, LEVOTHROID Take 50 mcg by mouth daily before breakfast.   magnesium hydroxide 400 MG/5ML suspension Commonly known as:  MILK OF MAGNESIA Take 30 mLs by mouth daily as needed for mild constipation or moderate constipation.   metFORMIN 1000 MG tablet Commonly known as:  GLUCOPHAGE Take 1,000 mg  by mouth daily with breakfast.   multivitamin with minerals tablet Take 1 tablet by mouth daily.   polyethylene glycol packet Commonly known as:  MIRALAX / GLYCOLAX Take 17 g by mouth daily as needed for moderate constipation.   sennosides-docusate sodium 8.6-50 MG tablet Commonly known as:  SENOKOT-S Take 1 tablet by mouth 2 (two) times daily.       No Known Allergies  Consultations:  None   Procedures/Studies: Dg Chest 2 View  Result Date: 04/16/2016 CLINICAL DATA:  Unwitnessed fall today with patient found on the floor. No cardiopulmonary complaints. History of diabetes, hypertension, atrial fibrillation. EXAM: CHEST  2 VIEW COMPARISON:  None in PACs FINDINGS: The lungs are mildly hypoinflated. The interstitial markings are coarse in the left lower lobe. The cardiac silhouette is enlarged. The central pulmonary vascularity is engorged. There is calcification in the wall of the aortic arch. There is no pleural effusion. The bony thorax exhibits no acute abnormality. IMPRESSION: Cardiomegaly with pulmonary vascular congestion suggests low-grade compensated CHF. Left lower lobe subsegmental atelectasis is suspected. Aortic atherosclerosis. Electronically Signed   By: David  Martinique M.D.   On: 04/16/2016 10:07   Ct Head Wo Contrast  Result Date: 04/16/2016 CLINICAL DATA:  Fall EXAM: CT HEAD WITHOUT CONTRAST CT CERVICAL SPINE WITHOUT CONTRAST TECHNIQUE: Multidetector CT imaging of the head and cervical spine was performed following the standard protocol without intravenous contrast. Multiplanar CT image reconstructions of the cervical spine were also generated. COMPARISON:  None. FINDINGS: CT HEAD FINDINGS Brain: Bifrontal extra-axial CSF fluid collections measuring 12 mm in thickness bilaterally. Prominent CSF also noted around the cerebellum bilaterally. No evidence of acute high-density hemorrhage. Ventricle size normal. Mild shift of the midline structures to the left approximately 2  mm. Mild chronic microvascular ischemic change in the white matter. No acute infarct or mass. Vascular: No hyperdense vessel or unexpected calcification. Skull: Fracture of the right orbital floor of indeterminate age. No fluid in the sinuses. CT face recommended if there is acute facial trauma. Otherwise no skull fracture. Sinuses/Orbits: Mastoid sinus effusion on the left with air-fluid level. Middle ear effusion on the left. Right mastoid sinus clear. Mucosal thickening and chronic bony thickening of the sphenoid sinus. Remaining paranasal sinuses clear. Other: None CT CERVICAL SPINE FINDINGS Alignment: Slight anterior slip C7-T1. Straightening of the cervical lordosis. Skull base and vertebrae: Negative for cervical spine fracture. Soft tissues and spinal canal: Negative for mass or adenopathy. Mild carotid artery calcification. Disc levels:  C2-3:  Negative C3-4: Disc degeneration and spondylosis. Bilateral facet degeneration. Mild spinal stenosis and moderate right foraminal encroachment. C4-5: Disc degeneration with uncinate spurring. Facet degeneration the left. No significant stenosis C5-6: Disc degeneration with diffuse uncinate spurring. Mild facet degeneration. Mild canal stenosis and mild foraminal stenosis bilaterally. C6-7: Disc degeneration with diffuse uncinate spurring. Mild spinal stenosis and mild foraminal narrowing bilaterally C7-T1: Mild anterior slip due to bilateral facet degeneration. No significant stenosis. Upper chest: Lung apices  clear. Other: None IMPRESSION: 12 mm subdural hygroma bilaterally. This could be due to acute injury or could be chronic. Prominent CSF around the cerebellum bilaterally. No prior study. No evidence of acute intracranial hemorrhage. Mild chronic microvascular ischemic change in the white matter. No acute infarct. Right orbital floor fracture of indeterminate age. There is no fluid in the sinuses. If the patient has acute facial trauma, CT face recommended for  further evaluation Cervical spondylosis.  Negative for fracture of the cervical spine. Electronically Signed   By: Franchot Gallo M.D.   On: 04/16/2016 09:57   Ct Cervical Spine Wo Contrast  Result Date: 04/16/2016 CLINICAL DATA:  Fall EXAM: CT HEAD WITHOUT CONTRAST CT CERVICAL SPINE WITHOUT CONTRAST TECHNIQUE: Multidetector CT imaging of the head and cervical spine was performed following the standard protocol without intravenous contrast. Multiplanar CT image reconstructions of the cervical spine were also generated. COMPARISON:  None. FINDINGS: CT HEAD FINDINGS Brain: Bifrontal extra-axial CSF fluid collections measuring 12 mm in thickness bilaterally. Prominent CSF also noted around the cerebellum bilaterally. No evidence of acute high-density hemorrhage. Ventricle size normal. Mild shift of the midline structures to the left approximately 2 mm. Mild chronic microvascular ischemic change in the white matter. No acute infarct or mass. Vascular: No hyperdense vessel or unexpected calcification. Skull: Fracture of the right orbital floor of indeterminate age. No fluid in the sinuses. CT face recommended if there is acute facial trauma. Otherwise no skull fracture. Sinuses/Orbits: Mastoid sinus effusion on the left with air-fluid level. Middle ear effusion on the left. Right mastoid sinus clear. Mucosal thickening and chronic bony thickening of the sphenoid sinus. Remaining paranasal sinuses clear. Other: None CT CERVICAL SPINE FINDINGS Alignment: Slight anterior slip C7-T1. Straightening of the cervical lordosis. Skull base and vertebrae: Negative for cervical spine fracture. Soft tissues and spinal canal: Negative for mass or adenopathy. Mild carotid artery calcification. Disc levels:  C2-3:  Negative C3-4: Disc degeneration and spondylosis. Bilateral facet degeneration. Mild spinal stenosis and moderate right foraminal encroachment. C4-5: Disc degeneration with uncinate spurring. Facet degeneration the left.  No significant stenosis C5-6: Disc degeneration with diffuse uncinate spurring. Mild facet degeneration. Mild canal stenosis and mild foraminal stenosis bilaterally. C6-7: Disc degeneration with diffuse uncinate spurring. Mild spinal stenosis and mild foraminal narrowing bilaterally C7-T1: Mild anterior slip due to bilateral facet degeneration. No significant stenosis. Upper chest: Lung apices clear. Other: None IMPRESSION: 12 mm subdural hygroma bilaterally. This could be due to acute injury or could be chronic. Prominent CSF around the cerebellum bilaterally. No prior study. No evidence of acute intracranial hemorrhage. Mild chronic microvascular ischemic change in the white matter. No acute infarct. Right orbital floor fracture of indeterminate age. There is no fluid in the sinuses. If the patient has acute facial trauma, CT face recommended for further evaluation Cervical spondylosis.  Negative for fracture of the cervical spine. Electronically Signed   By: Franchot Gallo M.D.   On: 04/16/2016 09:57   Dg Hip Unilat W Or Wo Pelvis 2-3 Views Left  Result Date: 04/16/2016 CLINICAL DATA:  Unwitnessed fall today. The patient reports left hip pain. History of previous ORIF for left hip fracture. EXAM: DG HIP (WITH OR WITHOUT PELVIS) 2-3V LEFT COMPARISON:  None in PACs FINDINGS: A telescoping screw with sideplate are present for fixation of a previous femoral neck fracture. No acute fracture is observed. The interface of the cortical screws with the native bone appears normal. There is mild narrowing of both hip joint spaces. There  is diffuse osteopenia of the bony structures. The observed portions of the bony pelvis are normal. IMPRESSION: No acute left hip fracture is observed. Previous ORIF with sideplate and telescoping screw appears intact. The observed portions of the bony pelvis exhibit no acute fractures. Electronically Signed   By: David  Martinique M.D.   On: 04/16/2016 10:09     Subjective: Seen and  examined at bedside and was doing well. No Active complaints.  Discharge Exam: Vitals:   04/18/16 0546 04/18/16 0930  BP: 138/71 (!) 142/70  Pulse: 70   Resp: 18   Temp: 97.8 F (36.6 C)    Vitals:   04/17/16 1300 04/17/16 2235 04/18/16 0546 04/18/16 0930  BP: (!) 152/88 113/68 138/71 (!) 142/70  Pulse: 82 74 70   Resp: 18 18 18    Temp: 98.3 F (36.8 C) 97.6 F (36.4 C) 97.8 F (36.6 C)   TempSrc: Oral Oral Oral   SpO2: 96% 95% 93%   Weight:   72.8 kg (160 lb 7.9 oz)   Height:       General: Pt is awake, not in acute distress Cardiovascular: RRR, S1/S2 +, no rubs, no gallops Respiratory: CTA bilaterally, no wheezing, no rhonchi Abdominal: Soft, NT, ND, bowel sounds + Extremities: no edema, no cyanosis  The results of significant diagnostics from this hospitalization (including imaging, microbiology, ancillary and laboratory) are listed below for reference.     Microbiology: No results found for this or any previous visit (from the past 240 hour(s)).   Labs: BNP (last 3 results)  Recent Labs  04/16/16 1004  BNP [id number]*   Basic Metabolic Panel:  Recent Labs Lab 04/16/16 1004 04/17/16 0459 04/18/16 0447  NA 140 140 141  K 3.6 3.4* 3.5  CL 104 103 103  CO2 29 30 28   GLUCOSE 126* 119* 144*  BUN 15 15 17   CREATININE 0.90 0.98 1.18*  CALCIUM 10.2 9.6 9.6   Liver Function Tests:  Recent Labs Lab 04/16/16 1004  AST 18  ALT 11*  ALKPHOS 44  BILITOT 0.8  PROT 6.0*  ALBUMIN 3.9   No results for input(s): LIPASE, AMYLASE in the last 168 hours. No results for input(s): AMMONIA in the last 168 hours. CBC:  Recent Labs Lab 04/16/16 1004 04/17/16 0459  WBC 15.6* 13.6*  NEUTROABS 6.6  --   HGB 13.7 13.3  HCT 39.6 39.3  MCV 93.4 96.1  PLT 163 165   Cardiac Enzymes:  Recent Labs Lab 04/16/16 1004  TROPONINI 0.10*   BNP: Invalid input(s): POCBNP CBG:  Recent Labs Lab 04/17/16 1143 04/17/16 1250 04/17/16 1644 04/17/16 2238  04/18/16 0746  GLUCAP 221* 340* 184* 145* 142*   D-Dimer No results for input(s): DDIMER in the last 72 hours. Hgb A1c No results for input(s): HGBA1C in the last 72 hours. Lipid Profile No results for input(s): CHOL, HDL, LDLCALC, TRIG, CHOLHDL, LDLDIRECT in the last 72 hours. Thyroid function studies No results for input(s): TSH, T4TOTAL, T3FREE, THYROIDAB in the last 72 hours.  Invalid input(s): FREET3 Anemia work up No results for input(s): VITAMINB12, FOLATE, FERRITIN, TIBC, IRON, RETICCTPCT in the last 72 hours. Urinalysis    Component Value Date/Time   COLORURINE YELLOW 04/16/2016 1125   APPEARANCEUR CLEAR 04/16/2016 1125   LABSPEC 1.013 04/16/2016 1125   PHURINE 7.5 04/16/2016 1125   GLUCOSEU NEGATIVE 04/16/2016 1125   HGBUR NEGATIVE 04/16/2016 1125   BILIRUBINUR NEGATIVE 04/16/2016 1125   KETONESUR 15 (A) 04/16/2016 1125   PROTEINUR NEGATIVE 04/16/2016  [place] 04/16/2016 1125   LEUKOCYTESUR SMALL (A) 04/16/2016 1125   Sepsis Labs Invalid input(s): PROCALCITONIN,  WBC,  LACTICIDVEN Microbiology No results found for this or any previous visit (from the past 240 hour(s)).  Time coordinating discharge: Over 30 minutes  SIGNED:  Kerney Elbe, DO [facility] 04/18/2016, 11:03 AM Pager [phone]  If 7PM-7AM, please contact night-coverage [email or web address] Password [email or web address]

## 2017-10-08 IMAGING — CT CT HEAD W/O CM
3 of 8 series · 13 of 47 positions shown, 15 images · non-contrast
Comparison: None.

CLINICAL DATA: Fall

EXAM:
CT HEAD WITHOUT CONTRAST
CT CERVICAL SPINE WITHOUT CONTRAST
TECHNIQUE: Multidetector CT imaging of the head and cervical spine was
performed following the standard protocol without intravenous
contrast. Multiplanar CT image reconstructions of the cervical spine
were also generated.

[Series 6: coronal · coronal · 0.28mm/px · 3 of 74 slices shown]
[im 21/74  brain]
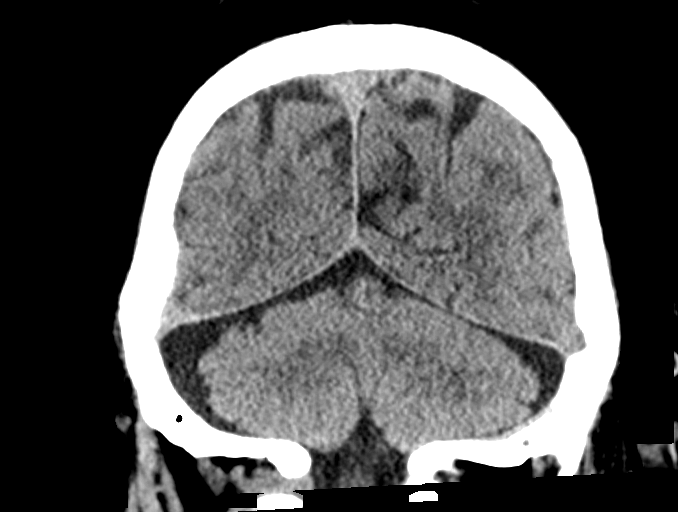
[im 32/74  brain]
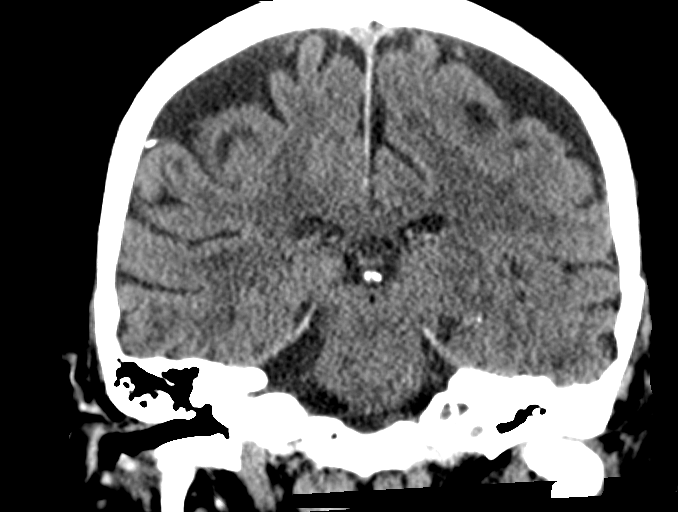
[im 42/74  brain]
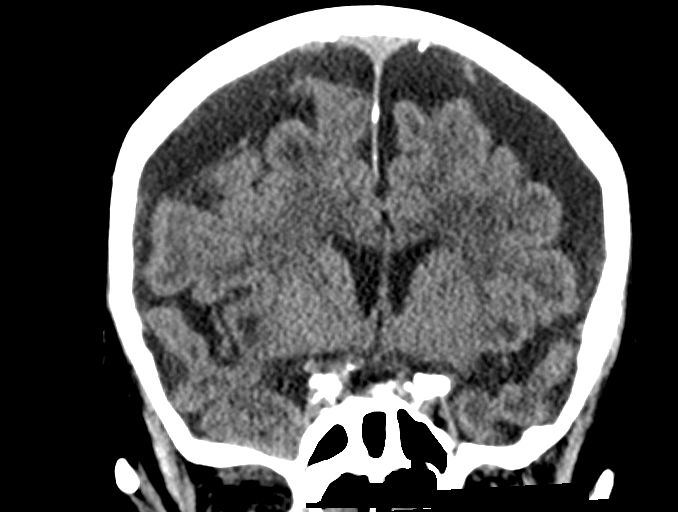

[Series 11: axial recon · axial · 0.23mm/px · z∈[-282,-157]mm · 8 of 85 slices shown, 10 images]
[im 10/85  brain]
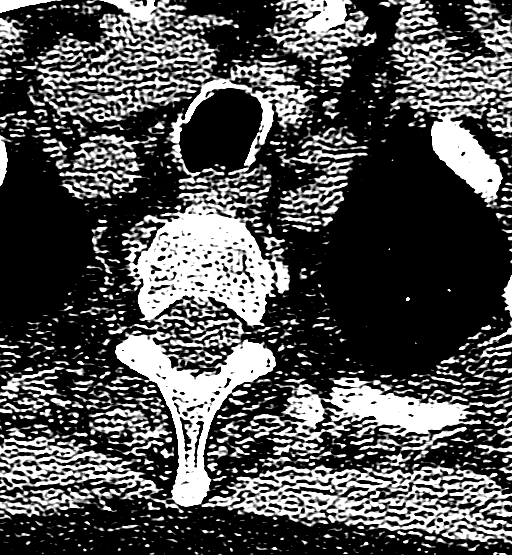
[im 10/85  bone]
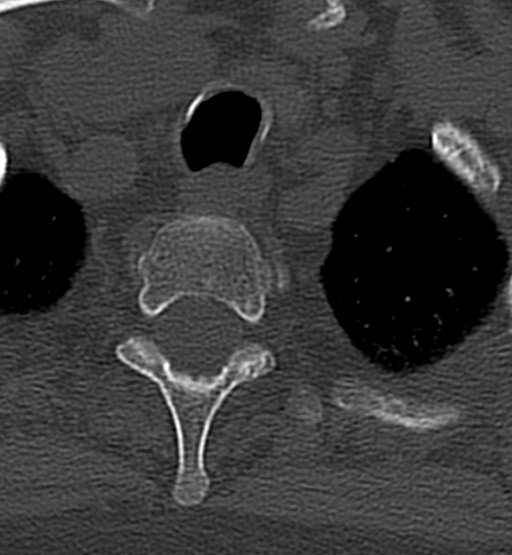
[im 19/85  brain]
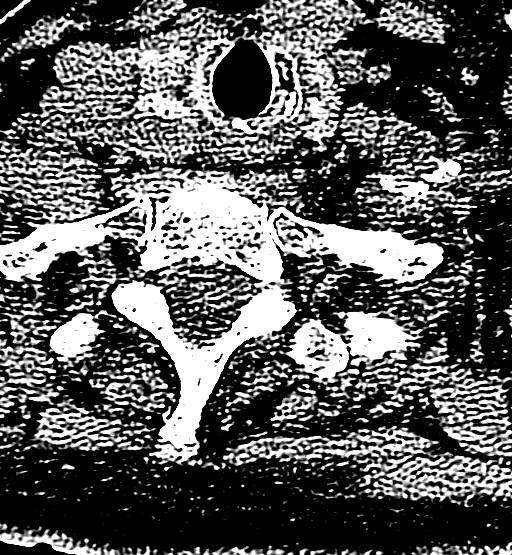
[im 29/85  brain]
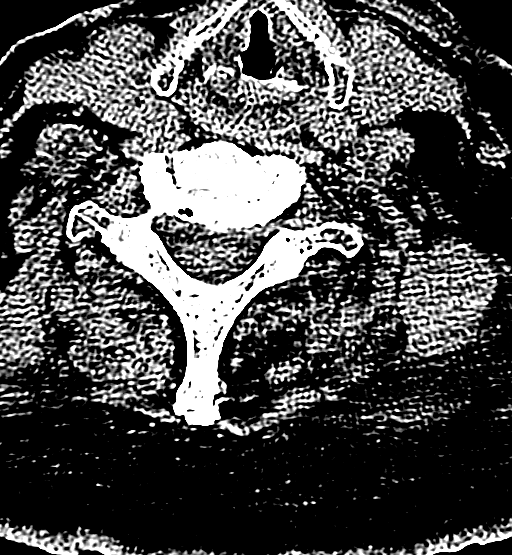
[im 38/85  brain]
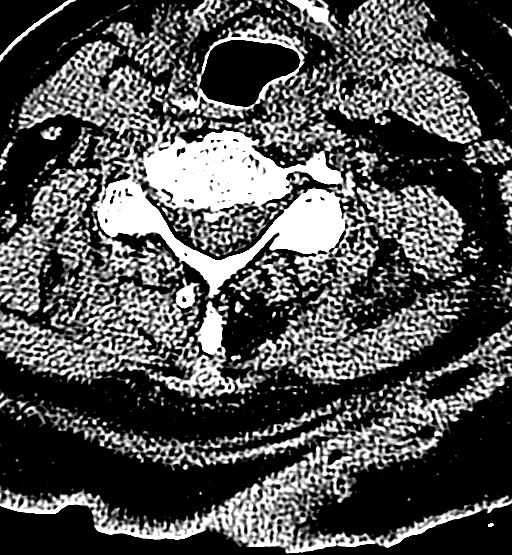
[im 47/85  brain]
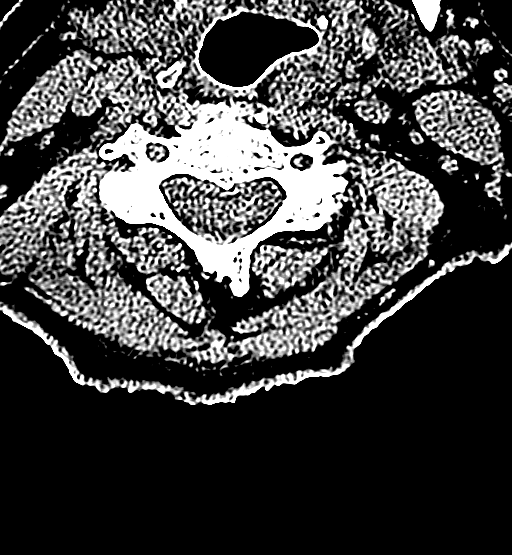
[im 47/85  bone]
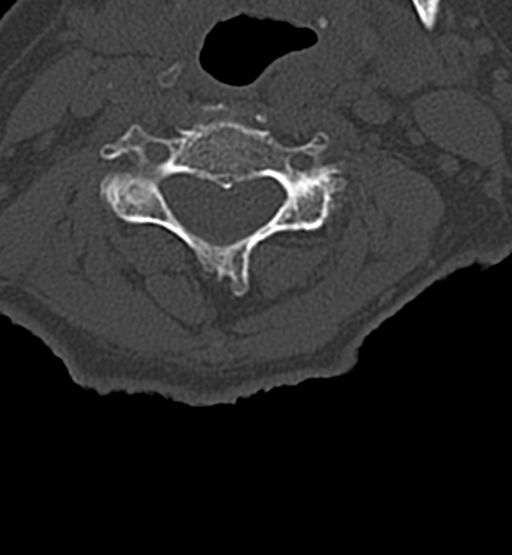
[im 57/85  brain]
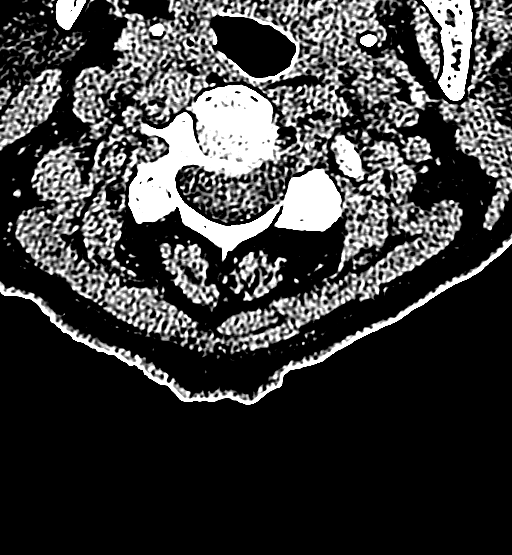
[im 66/85  brain]
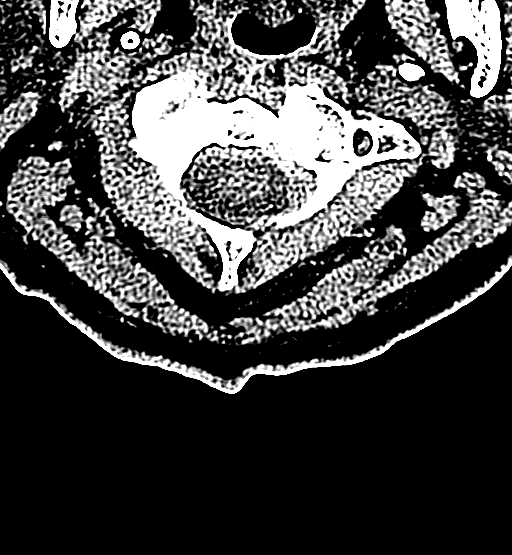
[im 75/85  brain]
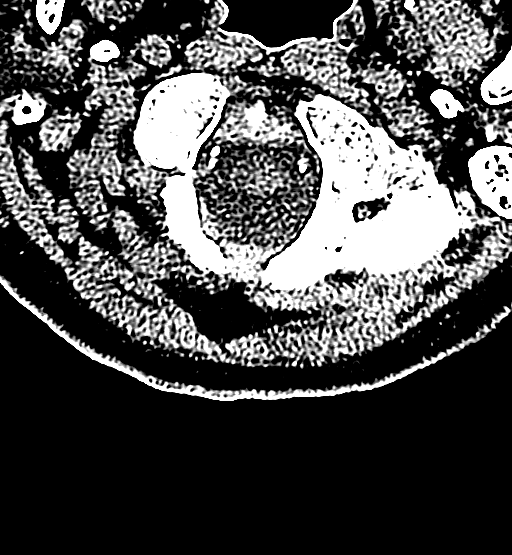

[Series 13: sagittal · sagittal · 0.26mm/px · 2 of 65 slices shown]
[im 22/65  brain]
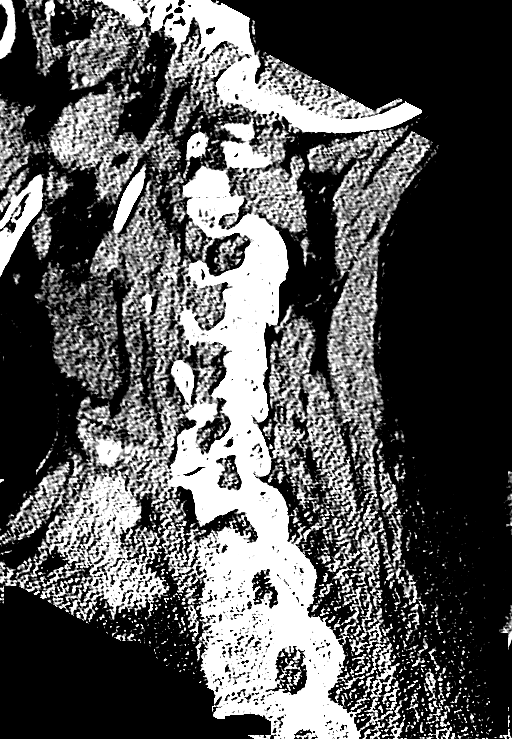
[im 43/65  brain]
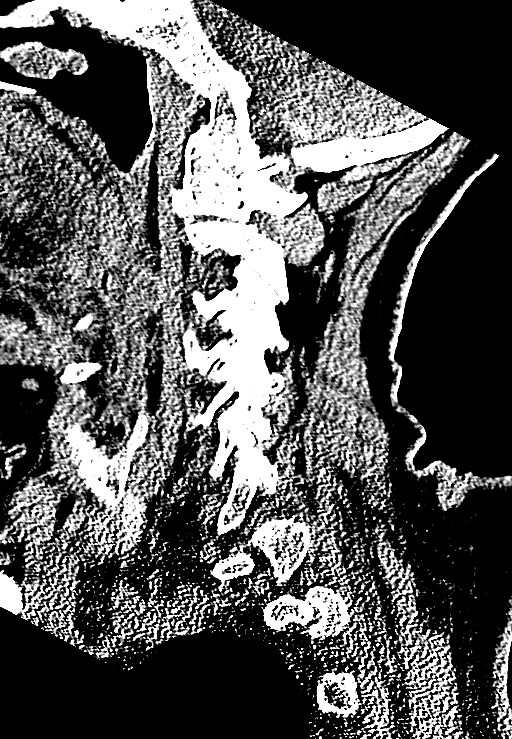

[13 of 47 positions shown; findings below may reference images not displayed]

FINDINGS: CT HEAD FINDINGS

Brain: Bifrontal extra-axial CSF fluid collections measuring 12 mm
in thickness bilaterally. Prominent CSF also noted around the
cerebellum bilaterally. No evidence of acute high-density
hemorrhage.

Ventricle size normal. Mild shift of the midline structures to the
left approximately 2 mm. Mild chronic microvascular ischemic change
in the white matter. No acute infarct or mass.

Vascular: No hyperdense vessel or unexpected calcification.

Skull: Fracture of the right orbital floor of indeterminate age. No
fluid in the sinuses. CT face recommended if there is acute facial
trauma. Otherwise no skull fracture.

Sinuses/Orbits: Mastoid sinus effusion on the left with air-fluid
level. Middle ear effusion on the left. Right mastoid sinus clear.
Mucosal thickening and chronic bony thickening of the sphenoid
sinus. Remaining paranasal sinuses clear.

Other: None

CT CERVICAL SPINE FINDINGS

Alignment: Slight anterior slip C7-T1. Straightening of the cervical
lordosis.

Skull base and vertebrae: Negative for cervical spine fracture.

Soft tissues and spinal canal: Negative for mass or adenopathy. Mild
carotid artery calcification.

Disc levels:  C2-3:  Negative

C3-4: Disc degeneration and spondylosis. Bilateral facet
degeneration. Mild spinal stenosis and moderate right foraminal
encroachment.

C4-5: Disc degeneration with uncinate spurring. Facet degeneration
the left. No significant stenosis

C5-6: Disc degeneration with diffuse uncinate spurring. Mild facet
degeneration. Mild canal stenosis and mild foraminal stenosis
bilaterally.

C6-7: Disc degeneration with diffuse uncinate spurring. Mild spinal
stenosis and mild foraminal narrowing bilaterally

C7-T1: Mild anterior slip due to bilateral facet degeneration. No
significant stenosis.

Upper chest: Lung apices clear.

Other: None
IMPRESSION: 12 mm subdural hygroma bilaterally. This could be due to acute
injury or could be chronic. Prominent CSF around the cerebellum
bilaterally. No prior study. No evidence of acute intracranial
hemorrhage.

Mild chronic microvascular ischemic change in the white matter. No
acute infarct.

Right orbital floor fracture of indeterminate age. There is no fluid
in the sinuses. If the patient has acute facial trauma, CT face
recommended for further evaluation

Cervical spondylosis.  Negative for fracture of the cervical spine.

## 2017-10-17 ENCOUNTER — Other Ambulatory Visit: Payer: Self-pay | Admitting: Nurse Practitioner

## 2017-10-17 ENCOUNTER — Ambulatory Visit
Admission: RE | Admit: 2017-10-17 | Discharge: 2017-10-17 | Disposition: A | Discharge status: Home / Self Care | Payer: Medicare (Managed Care) | Source: Ambulatory Visit | Attending: Nurse Practitioner | Admitting: Nurse Practitioner

## 2017-10-17 DIAGNOSIS — M25552 Pain in left hip: Secondary | ICD-10-CM

## 2017-10-17 DIAGNOSIS — M25562 Pain in left knee: Secondary | ICD-10-CM

## 2019-04-11 DEATH — deceased
